# Patient Record
Sex: Male | Born: 1970 | Race: Black or African American | Hispanic: No | Marital: Married | Smoking: Former smoker
Health system: Southern US, Community
[De-identification: ages and names within clinical notes are randomized; demographics above are authoritative.]

## PROBLEM LIST (undated history)

## (undated) DIAGNOSIS — R509 Fever, unspecified: Secondary | ICD-10-CM

## (undated) DIAGNOSIS — E876 Hypokalemia: Secondary | ICD-10-CM

## (undated) DIAGNOSIS — K859 Acute pancreatitis without necrosis or infection, unspecified: Secondary | ICD-10-CM

## (undated) DIAGNOSIS — R140 Abdominal distension (gaseous): Secondary | ICD-10-CM

## (undated) HISTORY — DX: Hypokalemia: E87.6

## (undated) HISTORY — DX: Fever, unspecified: R50.9

## (undated) HISTORY — PX: APPENDECTOMY: SHX54

## (undated) HISTORY — DX: Abdominal distension (gaseous): R14.0

---

## 1997-11-26 ENCOUNTER — Encounter: Payer: Self-pay | Admitting: *Deleted

## 1997-11-26 ENCOUNTER — Emergency Department (HOSPITAL_COMMUNITY): Admission: EM | Admit: 1997-11-26 | Discharge: 1997-11-26 | Payer: Self-pay | Admitting: *Deleted

## 1998-01-20 ENCOUNTER — Encounter: Payer: Self-pay | Admitting: Emergency Medicine

## 1998-01-20 ENCOUNTER — Emergency Department (HOSPITAL_COMMUNITY): Admission: EM | Admit: 1998-01-20 | Discharge: 1998-01-20 | Payer: Self-pay | Admitting: Emergency Medicine

## 1998-08-16 ENCOUNTER — Emergency Department (HOSPITAL_COMMUNITY): Admission: EM | Admit: 1998-08-16 | Discharge: 1998-08-16 | Payer: Self-pay | Admitting: Emergency Medicine

## 1999-06-12 ENCOUNTER — Emergency Department (HOSPITAL_COMMUNITY): Admission: EM | Admit: 1999-06-12 | Discharge: 1999-06-12 | Payer: Self-pay | Admitting: Emergency Medicine

## 2002-05-12 ENCOUNTER — Emergency Department (HOSPITAL_COMMUNITY): Admission: EM | Admit: 2002-05-12 | Discharge: 2002-05-12 | Payer: Self-pay | Admitting: Emergency Medicine

## 2003-06-27 ENCOUNTER — Emergency Department (HOSPITAL_COMMUNITY): Admission: EM | Admit: 2003-06-27 | Discharge: 2003-06-27 | Payer: Self-pay | Admitting: Emergency Medicine

## 2004-01-22 ENCOUNTER — Emergency Department (HOSPITAL_COMMUNITY): Admission: EM | Admit: 2004-01-22 | Discharge: 2004-01-22 | Payer: Self-pay | Admitting: Emergency Medicine

## 2004-03-04 ENCOUNTER — Emergency Department (HOSPITAL_COMMUNITY): Admission: EM | Admit: 2004-03-04 | Discharge: 2004-03-04 | Payer: Self-pay | Admitting: Emergency Medicine

## 2005-10-25 ENCOUNTER — Emergency Department (HOSPITAL_COMMUNITY): Admission: EM | Admit: 2005-10-25 | Discharge: 2005-10-25 | Payer: Self-pay | Admitting: *Deleted

## 2006-12-21 ENCOUNTER — Observation Stay (HOSPITAL_COMMUNITY): Admission: EM | Admit: 2006-12-21 | Discharge: 2006-12-22 | Payer: Self-pay | Admitting: Emergency Medicine

## 2006-12-21 ENCOUNTER — Encounter (INDEPENDENT_AMBULATORY_CARE_PROVIDER_SITE_OTHER): Payer: Self-pay | Admitting: General Surgery

## 2010-07-26 NOTE — H&P (Signed)
NAME:  Ivan Harper, Ivan Harper NO.:  [id number]   MEDICAL RECORD NO.:  [id number]          PATIENT TYPE:  INP   LOCATION:  1522                         FACILITY:  [facility]   PHYSICIAN:  Adolph Pollack, M.D.DATE OF BIRTH:  [birth year]   DATE OF ADMISSION:  12/21/2006  DATE OF DISCHARGE:  12/22/2006                              HISTORY & PHYSICAL   REASON FOR ADMISSION:  Acute appendicitis.   HISTORY OF PRESENT ILLNESS:  Ivan Harper is a 40 year old male who awoke  this morning with some pressure-type periumbilical pain.  He got to  work, had a bowel movement, but continued to have the pain.  It  escalated and he had nausea and vomiting after drinking milk.  He  subsequently presented to the emergency department, was evaluated, and  was noted have right lower quadrant tenderness.  A CT scan was performed  demonstrating findings consistent with acute appendicitis but no  perforation.  It was at this time I was asked to see him.  He has not  had a fever.   PAST MEDICAL HISTORY:  Childhood seizures.   PREVIOUS OPERATIONS:  None.   ALLERGIES:  None.   MEDICATIONS:  None.   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  Occasional drinker  and a former smoker.   REVIEW OF SYSTEMS:  CARDIOVASCULAR:  No known heart disease or  hypertension.  PULMONARY:  No pneumonia, asthma, tuberculosis.  GI:  No  peptic ulcer disease, hepatitis, colitis, melena, hematochezia.  GU:  No  kidney stones, hematuria, dysuria.  ENDOCRINE:  No thyroid disease,  hypercholesterolemia.  NEUROLOGIC:  Seizure disorder as a child, which  he has outgrown.  HEMATOLOGIC:  No bleeding disorders, blood clots or  transfusions.   PHYSICAL EXAMINATION:  GENERALLY:  A well-developed, well-nourished  male, appears slightly ill.  Temperature 97.2, blood pressure 109/63,  pulse 65, O2 saturations 96-100% on room air.  EYES:  Extraocular motions intact.  No icterus.  NECK:  Supple without masses or  obvious thyroid enlargement.  RESPIRATORY:  Breath sounds equal and clear, respirations unlabored.  CARDIOVASCULAR:  Demonstrates a regular rate, regular rhythm.  No murmur  heard.  No JVD or lower extremity edema.  ABDOMEN:  Soft.  There is right lower quadrant tenderness and guarding  to palpation and percussion, but no mass noted.  No hernias noted.  MUSCULOSKELETAL:  He has good muscle tone, good range of motion.  NEUROLOGIC:  He is alert and oriented, answers questions appropriately,  has normal strength.   LABORATORY DATA:  Demonstrates a white count of 9600 with a left shift.  Glucose slightly elevated 131, potassium low at 3.4.  CT scan reviewed  and shows findings consistent with acute appendicitis without  perforation.   IMPRESSION:  Acute appendicitis.   PLAN:  Laparoscopic, possible open appendectomy.  The procedure and  risks including but not limited to bleeding, infection, wound healing  problems, anesthesia, and accidental damage to intra-abdominal organs  (such as intestines, bladder, etc.) have been discussed with him.  He  seems to understand and agrees to proceed.  Will also start him on IV  antibiotics.      Adolph Pollack, M.D.  Electronically Signed     TJR/MEDQ  D:  12/21/2006  T:  12/22/2006  Job:  [id number]

## 2010-07-26 NOTE — Op Note (Signed)
NAME:  [name], [name] NO.:  [id number]   MEDICAL RECORD NO.:  [id number]          PATIENT TYPE:  INP   LOCATION:  0112                         FACILITY:  [facility]   PHYSICIAN:  Adolph Pollack, M.D.DATE OF BIRTH:  [birth year]   DATE OF PROCEDURE:  12/21/2006  DATE OF DISCHARGE:                               OPERATIVE REPORT   PREOPERATIVE DIAGNOSIS:  Acute appendicitis.   POSTOPERATIVE DIAGNOSIS:  Acute appendicitis.   PROCEDURE:  Laparoscopic appendectomy.   SURGEON:  Adolph Pollack, M.D.   ANESTHESIA:  General.   INDICATIONS FOR PROCEDURE:  This 40 year old male presented to the  emergency department with persistent and increasing abdominal pain and  on evaluation was found to have acute appendicitis.  He is now brought  to the operating room.   SURGICAL TECHNIQUE:  He is brought to the operating room, placed supine  on the operating table, and general anesthetic was administered.  A  Foley catheter was placed in the bladder.  The hair on the abdominal  wall was clipped and the area sterilely prepped and draped.  Marcaine  was infiltrated in the subumbilical region and a subumbilical incision  was made through the skin, subcutaneous tissue, fascia and peritoneum  entering the peritoneal cavity.  A pursestring suture of 0 Vicryl was  placed around the fascial edges.  A Hassan trocar was introduced into  the peritoneal cavity and pneumoperitoneum created by insufflation of  CO2 gas.   Next, the laparoscope was introduced and he was placed in Trendelenburg  position with the right side tilted up.  A 5 mm trocar was placed  through a lower abdominal incision just to the left of midline.  A 5 mm  trocar was placed in the right upper quadrant.  I noted adhesions  between the cecum and the lateral sidewall which I divided sharply.  I  did notice the appendix coming off the cecum.  I traced this down into  the pelvis and noticed an acutely inflamed appendix  with some fibrinous  changes of the tip.  The appendix was adherent to the mesentery of the  distal ileum.  I carefully freed the appendix from the pelvic sidewall  and then gently dissected it free from the mesentery of the distal  ileum.  The appendix had some lateral sidewall attachments which I  divided sharply and then I was able to retract the appendix anteriorly.  The mesoappendix was then divided with the harmonic scalpel down to the  base of the cecum.  The appendix was amputated off the cecum with the  Endo-GIA stapler, placed in an Endopouch bag, and removed through the  subumbilical port.   The subumbilical port was replaced.  There was a raw surface along the  pelvic sidewall where some of the dissection was performed.  I copiously  irrigated out the right lower quadrant and examined the staple line  which was solid without bleeding.  There was a small amount of bleeding  from the pelvic sidewall which I controlled with the harmonic scalpel  and then placed Surgicel there.  Irrigation fluid was  evacuated and, at  this point, hemostasis was adequate.   Under laparoscopic vision, the lower abdominal trocar was removed and no  bleeding was noted.  The subumbilical trocar was removed and the fascial  defect closed under laparoscopic vision by tightening up and tying down  the  pursestring suture.  The CO2 gas was released and the remaining trocar  removed.  The skin incisions were then closed with 4-0 Monocryl  subcuticular stitches followed by Steri-Strips and sterile dressings.  He tolerated the procedure well without any apparent complications and  he was taken to recovery in satisfactory condition.      Adolph Pollack, M.D.  Electronically Signed     TJR/MEDQ  D:  12/21/2006  T:  12/22/2006  Job:  [id number]

## 2010-10-07 ENCOUNTER — Emergency Department (HOSPITAL_COMMUNITY)
Admission: EM | Admit: 2010-10-07 | Discharge: 2010-10-07 | Disposition: A | Discharge status: Home / Self Care | Payer: Worker's Compensation | Attending: Emergency Medicine | Admitting: Emergency Medicine

## 2010-10-07 DIAGNOSIS — T148XXA Other injury of unspecified body region, initial encounter: Secondary | ICD-10-CM | POA: Insufficient documentation

## 2010-10-07 DIAGNOSIS — Y92410 Unspecified street and highway as the place of occurrence of the external cause: Secondary | ICD-10-CM | POA: Insufficient documentation

## 2010-10-07 DIAGNOSIS — M542 Cervicalgia: Secondary | ICD-10-CM | POA: Insufficient documentation

## 2010-10-07 DIAGNOSIS — M549 Dorsalgia, unspecified: Secondary | ICD-10-CM | POA: Insufficient documentation

## 2010-12-22 LAB — BASIC METABOLIC PANEL
BUN: 10
CO2: 29
Calcium: 9
Chloride: 104
Creatinine, Ser: 0.81
GFR calc non Af Amer: >60
Glucose, Bld: 131 — ABNORMAL HIGH
Potassium: 3.4 — ABNORMAL LOW
Sodium: 140

## 2010-12-22 LAB — URINALYSIS, ROUTINE W REFLEX MICROSCOPIC
Bilirubin Urine: NEGATIVE
Glucose, UA: NEGATIVE
Nitrite: NEGATIVE
Protein, ur: NEGATIVE
pH: 7

## 2010-12-22 LAB — CBC
Hemoglobin: 12.5 — ABNORMAL LOW
RBC: 3.77 — ABNORMAL LOW
WBC: 9.6

## 2010-12-22 LAB — DIFFERENTIAL
Basophils Absolute: 0
Eosinophils Relative: 1
Lymphocytes Relative: 13
Lymphs Abs: 1.3

## 2012-09-30 ENCOUNTER — Encounter (HOSPITAL_COMMUNITY): Payer: Self-pay | Admitting: *Deleted

## 2012-09-30 ENCOUNTER — Emergency Department (HOSPITAL_COMMUNITY)
Admission: EM | Admit: 2012-09-30 | Discharge: 2012-09-30 | Disposition: A | Discharge status: Home / Self Care | Payer: Self-pay | Attending: Emergency Medicine | Admitting: Emergency Medicine

## 2012-09-30 DIAGNOSIS — Z23 Encounter for immunization: Secondary | ICD-10-CM | POA: Insufficient documentation

## 2012-09-30 DIAGNOSIS — S0990XA Unspecified injury of head, initial encounter: Secondary | ICD-10-CM | POA: Insufficient documentation

## 2012-09-30 DIAGNOSIS — IMO0002 Reserved for concepts with insufficient information to code with codable children: Secondary | ICD-10-CM | POA: Insufficient documentation

## 2012-09-30 DIAGNOSIS — S76219A Strain of adductor muscle, fascia and tendon of unspecified thigh, initial encounter: Secondary | ICD-10-CM

## 2012-09-30 DIAGNOSIS — Y9389 Activity, other specified: Secondary | ICD-10-CM | POA: Insufficient documentation

## 2012-09-30 DIAGNOSIS — Y92009 Unspecified place in unspecified non-institutional (private) residence as the place of occurrence of the external cause: Secondary | ICD-10-CM | POA: Insufficient documentation

## 2012-09-30 DIAGNOSIS — W208XXA Other cause of strike by thrown, projected or falling object, initial encounter: Secondary | ICD-10-CM | POA: Insufficient documentation

## 2012-09-30 MED ORDER — TETANUS-DIPHTH-ACELL PERTUSSIS 5-2.5-18.5 LF-MCG/0.5 IM SUSP
0.5000 mL | Freq: Once | INTRAMUSCULAR | Status: AC
  Administered 2012-09-30: 0.5 mL via INTRAMUSCULAR
  Filled 2012-09-30: qty 0.5

## 2012-09-30 NOTE — ED Notes (Signed)
Pt reports he was at home getting something out of pantry, and a 5 lb can fell on his head. No LOC. Not on blood thinners. Bleeding controlled. Small hematoma noted to top of pt's head. Pt wants to be evaluated to make sure he does not have concussion

## 2012-09-30 NOTE — ED Provider Notes (Signed)
   History    CSN: [id number] Arrival date & time 09/30/12  1241  First MD Initiated Contact with Patient 09/30/12 1251     Chief Complaint  Patient presents with  . Head Injury   (Consider location/radiation/quality/duration/timing/severity/associated sxs/prior Treatment) Patient is a 42 y.o. male presenting with head injury. The history is provided by the patient (pt states a can hit him in the head.  no loc). No language interpreter was used.  Head Injury Location:  Occipital Mechanism of injury: direct blow   Pain details:    Quality:  Aching   Severity:  Mild   Timing:  Constant Associated symptoms: headache   Associated symptoms: no seizures    History reviewed. No pertinent past medical history. Past Surgical History  Procedure Laterality Date  . Appendectomy     No family history on file. History  Substance Use Topics  . Smoking status: Never Smoker   . Smokeless tobacco: Not on file  . Alcohol Use: Yes    Review of Systems  Constitutional: Negative for appetite change and fatigue.  HENT: Negative for congestion, sinus pressure and ear discharge.   Eyes: Negative for discharge.  Respiratory: Negative for cough.   Cardiovascular: Negative for chest pain.  Gastrointestinal: Negative for abdominal pain and diarrhea.  Genitourinary: Negative for frequency and hematuria.  Musculoskeletal: Negative for back pain.  Skin: Negative for rash.  Neurological: Positive for headaches. Negative for seizures.  Psychiatric/Behavioral: Negative for hallucinations.    Allergies  Review of patient's allergies indicates no known allergies.  Home Medications   Current Outpatient Rx  Name  Route  Sig  Dispense  Refill  . Aspirin-Acetaminophen-Caffeine (GOODY HEADACHE PO)   Oral   Take 1 Package by mouth as needed (pain).          BP 138/92  Pulse 71  Temp(Src) 98.5 F (36.9 C) (Oral)  Resp 16  SpO2 99% Physical Exam  Constitutional: He is oriented to person,  place, and time. He appears well-developed.  HENT:  3 cm lac scalp  Eyes: Conjunctivae and EOM are normal. No scleral icterus.  Neck: Neck supple. No thyromegaly present.  Cardiovascular: Normal rate and regular rhythm.  Exam reveals no gallop and no friction rub.   No murmur heard. Pulmonary/Chest: No stridor. He has no wheezes. He has no rales. He exhibits no tenderness.  Abdominal: He exhibits no distension. There is no tenderness. There is no rebound.  Tender right groin,  No hernia  Musculoskeletal: Normal range of motion. He exhibits no edema.  Lymphadenopathy:    He has no cervical adenopathy.  Neurological: He is oriented to person, place, and time. Coordination normal.  Skin: No rash noted. No erythema.  Psychiatric: He has a normal mood and affect. His behavior is normal.    ED Course  LACERATION REPAIR Date/Time: 09/30/2012 1:30 PM Performed by: Jewelene Mairena L Authorized by: Bethann Berkshire L Comments: 5 staples used to close the 3 cm lac to occipital scalp   (including critical care time) Labs Reviewed - No data to display No results found. 1. Head injury, acute, initial encounter   2. Groin strain, initial encounter     MDM    Benny Lennert, MD 09/30/12 1331

## 2013-10-13 ENCOUNTER — Emergency Department (HOSPITAL_COMMUNITY)
Admission: EM | Admit: 2013-10-13 | Discharge: 2013-10-13 | Disposition: A | Discharge status: Home / Self Care | Payer: Self-pay | Attending: Emergency Medicine | Admitting: Emergency Medicine

## 2013-10-13 ENCOUNTER — Encounter (HOSPITAL_COMMUNITY): Payer: Self-pay | Admitting: Emergency Medicine

## 2013-10-13 DIAGNOSIS — R0789 Other chest pain: Secondary | ICD-10-CM

## 2013-10-13 DIAGNOSIS — R079 Chest pain, unspecified: Secondary | ICD-10-CM | POA: Insufficient documentation

## 2013-10-13 DIAGNOSIS — Z7982 Long term (current) use of aspirin: Secondary | ICD-10-CM | POA: Insufficient documentation

## 2013-10-13 DIAGNOSIS — R52 Pain, unspecified: Secondary | ICD-10-CM | POA: Insufficient documentation

## 2013-10-13 DIAGNOSIS — R071 Chest pain on breathing: Secondary | ICD-10-CM | POA: Insufficient documentation

## 2013-10-13 NOTE — ED Notes (Signed)
C/o pressure in center of chest since moving furniture at his house yesterday.  States pain feels like a pulled muscle and he felt it as soon as he went to lift heavy furniture.

## 2013-10-13 NOTE — ED Provider Notes (Signed)
CSN: [id number]     Arrival date & time 10/13/13  0606 History   First MD Initiated Contact with Patient 10/13/13 [phone]     Chief Complaint  Patient presents with  . Chest Pain     (Consider location/radiation/quality/duration/timing/severity/associated sxs/prior Treatment) HPI  Ivan Harper is a 43 y.o. male who injured his left chest wall, lifting, heavy for her, 3 days ago. He has had persistent pain, since then, which is on and off, and aggravated by movement. He has had a similar problem in the past. He denies shortness of breath, weakness, dizziness, back pain, nausea, vomiting. Today, he noticed the pain when he got up to go to work, so came here for evaluation. 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 There are no other known modifying factors.   History reviewed. No pertinent past medical history. Past Surgical History  Procedure Laterality Date  . Appendectomy     No family history on file. History  Substance Use Topics  . Smoking status: Never Smoker   . Smokeless tobacco: Not on file  . Alcohol Use: Yes    Review of Systems  All other systems reviewed and are negative.     Allergies  Review of patient's allergies indicates no known allergies.  Home Medications   Prior to Admission medications   Medication Sig Start Date End Date Taking? Authorizing Provider  aspirin 325 MG EC tablet Take 325 mg by mouth every 6 (six) hours as needed for pain.   Yes Historical Provider, MD   BP 119/85  Pulse 79  Temp(Src) 97.7 F (36.5 C) (Oral)  Resp 18  SpO2 100% Physical Exam  Nursing note and vitals reviewed. Constitutional: He is oriented to person, place, and time. He appears well-developed and well-nourished.  HENT:  Head: Normocephalic and atraumatic.  Right Ear: External ear normal.  Left Ear: External ear normal.  Eyes: Conjunctivae and EOM are normal. Pupils are equal, round, and reactive to light.  Neck: Normal range of motion and  phonation normal. Neck supple.  Cardiovascular: Normal rate, regular rhythm, normal heart sounds and intact distal pulses.   Pulmonary/Chest: Effort normal and breath sounds normal. He exhibits tenderness (left anterior, diffuse, mild.). He exhibits no bony tenderness.  Abdominal: Soft. There is no tenderness.  Musculoskeletal: Normal range of motion.  Neurological: He is alert and oriented to person, place, and time. No cranial nerve deficit or sensory deficit. He exhibits normal muscle tone. Coordination normal.  Skin: Skin is warm, dry and intact.  Psychiatric: He has a normal mood and affect. His behavior is normal. Judgment and thought content normal.    ED Course  Procedures (including critical care time) Labs Review Labs Reviewed - No data to display  Imaging Review No results found.   EKG Interpretation   Date/Time:  Monday October 13 2013 06:09:08 EDT Ventricular Rate:  82 PR Interval:  136 QRS Duration: 82 QT Interval:  352 QTC Calculation: 411 R Axis:   48 Text Interpretation:  Normal sinus rhythm Possible Left atrial enlargement  Borderline ECG since last tracing no significant change Confirmed by Tyr Franca   MD, Cerenity Goshorn ([id number]) on 10/13/2013 7:16:30 AM      MDM   Final diagnoses:  Acute chest wall pain    Chest wall pain related to injury, lifting. Doubt ACS, PE, or pneumonia.  Nursing Notes Reviewed/ Care Coordinated Applicable Imaging Reviewed Interpretation of Laboratory Data incorporated into ED treatment  The patient appears  reasonably screened and/or stabilized for discharge and I doubt any other medical condition or other [facility] requiring further screening, evaluation, or treatment in the ED at this time prior to discharge.  Plan: Home Medications- IBU prn; Home Treatments- rest; return here if the recommended treatment, does not improve the symptoms; Recommended follow up- PCP prn    Flint MelterElliott L Rocsi Hazelbaker, MD 10/13/13 [phone]

## 2013-10-13 NOTE — ED Notes (Signed)
Pt c/o chest soreness after moving furniture. Pt states he took ibuprofen and the pain sort of got better but not completely improved. Pt states pain is worse when he attempts to get up out of bed and when he lifts his arms above his head. Pt rates pain 3/10 at this time.

## 2013-10-13 NOTE — Discharge Instructions (Signed)
Chest Wall Pain °Chest wall pain is pain felt in or around the chest bones and muscles. It may take up to 6 weeks to get better. It may take longer if you are active. Chest wall pain can happen on its own. Other times, things like germs, injury, coughing, or exercise can cause the pain. °HOME CARE  °· Avoid activities that make you tired or cause pain. Try not to use your chest, belly (abdominal), or side muscles. Do not use heavy weights. °· Put ice on the sore area. °¨ Put ice in a plastic bag. °¨ Place a towel between your skin and the bag. °¨ Leave the ice on for 15-20 minutes for the first 2 days. °· Only take medicine as told by your doctor. °GET HELP RIGHT AWAY IF:  °· You have more pain or are very uncomfortable. °· You have a fever. °· Your chest pain gets worse. °· You have new problems. °· You feel sick to your stomach (nauseous) or throw up (vomit). °· You start to sweat or feel lightheaded. °· You have a cough with mucus (phlegm). °· You cough up blood. °MAKE SURE YOU:  °· Understand these instructions. °· Will watch your condition. °· Will get help right away if you are not doing well or get worse. °Document Released: 08/16/2007 Document Revised: 05/22/2011 Document Reviewed: 10/24/2010 °Exit[employer]® Patient Information ©2015 [employer]. This information is not intended to replace advice given to you by your health care provider. Make sure you discuss any questions you have with your health care provider. ° °

## 2014-04-15 ENCOUNTER — Emergency Department (HOSPITAL_COMMUNITY)
Admission: EM | Admit: 2014-04-15 | Discharge: 2014-04-15 | Disposition: A | Discharge status: Home / Self Care | Payer: No Typology Code available for payment source | Attending: Emergency Medicine | Admitting: Emergency Medicine

## 2014-04-15 ENCOUNTER — Encounter (HOSPITAL_COMMUNITY): Payer: Self-pay

## 2014-04-15 DIAGNOSIS — K029 Dental caries, unspecified: Secondary | ICD-10-CM | POA: Insufficient documentation

## 2014-04-15 DIAGNOSIS — Z792 Long term (current) use of antibiotics: Secondary | ICD-10-CM | POA: Insufficient documentation

## 2014-04-15 DIAGNOSIS — J029 Acute pharyngitis, unspecified: Secondary | ICD-10-CM | POA: Insufficient documentation

## 2014-04-15 DIAGNOSIS — K047 Periapical abscess without sinus: Secondary | ICD-10-CM | POA: Insufficient documentation

## 2014-04-15 MED ORDER — METHYLPREDNISOLONE SODIUM SUCC 125 MG IJ SOLR: 125.0000 mg | Freq: Once | INTRAMUSCULAR | Status: DC

## 2014-04-15 MED ORDER — PENICILLIN V POTASSIUM 500 MG PO TABS: 500.0000 mg | ORAL_TABLET | Freq: Four times a day (QID) | ORAL | Status: DC

## 2014-04-15 MED ORDER — HYDROCODONE-ACETAMINOPHEN 5-325 MG PO TABS: 1.0000 | ORAL_TABLET | ORAL | Status: DC | PRN

## 2014-04-15 MED ORDER — METHYLPREDNISOLONE SODIUM SUCC 125 MG IJ SOLR
125.0000 mg | Freq: Once | INTRAMUSCULAR | Status: AC
  Administered 2014-04-15: 125 mg via INTRAMUSCULAR
  Filled 2014-04-15: qty 2

## 2014-04-15 NOTE — Discharge Instructions (Signed)
Take the prescribed medication as directed. Monitor swelling closely-- if you notice significant increase, difficulty swallowing, shortness of breath, please return to the ED, otherwise follow-up with your dentist as soon as possible.

## 2014-04-15 NOTE — ED Notes (Signed)
Pt woke up this morning with swelling around mouth.  Pt has dental decay and soon to have surgery.  Mouth and face with pain

## 2014-04-15 NOTE — ED Provider Notes (Signed)
CSN: [id number]     Arrival date & time 04/15/14  1623 History  This chart was scribed for non-physician practitioner, Sharilyn SitesLisa Charlean Carneal, PA-C, working with Merrie RoofJohn David Wofford III, MD by Charline BillsEssence Howell, ED Scribe. This patient was seen in room WTR7/WTR7 and the patient's care was started at 4:37 PM.   Chief Complaint  Patient presents with  . Facial Swelling   The history is provided by the patient. No language interpreter was used.   HPI Comments: Ivan Harper is a 44 y.o. male who presents to the Emergency Department complaining of sudden onset of facial swelling around his mouth first noted this morning upon waking. Pt reports associated dental pain and gum swelling.  He reports upcoming dental surgery to remove his upper teeth and get fitted for dentures. Pt denies new medications or food. No known allergies.  No difficulty swallowing or shortness of breath.  VSS on arrival.  History reviewed. No pertinent past medical history. Past Surgical History  Procedure Laterality Date  . Appendectomy     History reviewed. No pertinent family history. History  Substance Use Topics  . Smoking status: Never Smoker   . Smokeless tobacco: Not on file  . Alcohol Use: Yes    Review of Systems  HENT: Positive for dental problem, facial swelling and sore throat.   All other systems reviewed and are negative.  Allergies  Review of patient's allergies indicates no known allergies.  Home Medications   Prior to Admission medications   Medication Sig Start Date End Date Taking? Authorizing Provider  aspirin 325 MG EC tablet Take 325 mg by mouth every 6 (six) hours as needed for pain.    Historical Provider, MD   Triage Vitals: BP 155/101 mmHg  Pulse 85  Temp(Src) 98.9 F (37.2 C) (Oral)  Resp 18  SpO2 99% Physical Exam  Constitutional: He is oriented to person, place, and time. He appears well-developed and well-nourished. No distress.  HENT:  Head: Normocephalic and atraumatic.   Mouth/Throat: Uvula is midline, oropharynx is clear and moist and mucous membranes are normal. Abnormal dentition. Dental abscesses and dental caries present. No oropharyngeal exudate, posterior oropharyngeal edema, posterior oropharyngeal erythema or tonsillar abscesses.  Teeth largely in very poor dentition, 4 front teeth are broken and nearly flush with gum line with cavities present, surrounding gingiva swollen and erythematous, gums locally TTP; mild swelling noted to inner margin of upper lip; no facial or neck swelling; handling secretions appropriately, no trismus, normal phonation, no stridor  Eyes: Conjunctivae and EOM are normal. Pupils are equal, round, and reactive to light.  Neck: Normal range of motion. Neck supple.  Cardiovascular: Normal rate, regular rhythm and normal heart sounds.   Pulmonary/Chest: Effort normal and breath sounds normal. No respiratory distress. He has no wheezes.  Musculoskeletal: Normal range of motion.  Neurological: He is alert and oriented to person, place, and time.  Skin: Skin is warm and dry. He is not diaphoretic.  Psychiatric: He has a normal mood and affect.  Nursing note and vitals reviewed.  ED Course  Procedures (including critical care time) DIAGNOSTIC STUDIES: Oxygen Saturation is 99% on RA, normal by my interpretation.    COORDINATION OF CARE: 4:39 PM-Discussed treatment plan which includes Solu-Medrol injection with pt at bedside and pt agreed to plan.   Labs Review Labs Reviewed - No data to display  Imaging Review No results found.   EKG Interpretation None      MDM   Final diagnoses:  Dental  infection   44 year old male with chronic dental infections scattered throughout his mouth. He will this morning with dental pain and mild swelling of his upper lip. He is scheduled to have all of his teeth removed by dentist in the next few weeks. Patient afebrile and nontoxic on exam. His 4 front teeth are broken and flushed with  gumline with obvious infection. The inner margin of his upper lip is mildly swollen without definitive abscess. He is handling his secretions well, speaking in full complete sentences. No cheek or neck swelling. No concern for lipids angina. Patient will be started on antibiotics and pain medication. He will follow-up with his dentist.  Discussed plan with patient, he/she acknowledged understanding and agreed with plan of care.  Return precautions given for new or worsening symptoms.  I personally performed the services described in this documentation, which was scribed in my presence. The recorded information has been reviewed and is accurate.  Garlon Hatchet, PA-C 04/15/14 1704  Merrie Roof, MD 04/16/14 [phone]

## 2014-06-06 ENCOUNTER — Emergency Department (HOSPITAL_COMMUNITY)
Admission: EM | Admit: 2014-06-06 | Discharge: 2014-06-06 | Disposition: A | Discharge status: Home / Self Care | Payer: Self-pay | Attending: Emergency Medicine | Admitting: Emergency Medicine

## 2014-06-06 ENCOUNTER — Encounter (HOSPITAL_COMMUNITY): Payer: Self-pay | Admitting: Emergency Medicine

## 2014-06-06 DIAGNOSIS — Y92410 Unspecified street and highway as the place of occurrence of the external cause: Secondary | ICD-10-CM | POA: Insufficient documentation

## 2014-06-06 DIAGNOSIS — Y9389 Activity, other specified: Secondary | ICD-10-CM | POA: Insufficient documentation

## 2014-06-06 DIAGNOSIS — T148XXA Other injury of unspecified body region, initial encounter: Secondary | ICD-10-CM

## 2014-06-06 DIAGNOSIS — Y998 Other external cause status: Secondary | ICD-10-CM | POA: Insufficient documentation

## 2014-06-06 DIAGNOSIS — S39012A Strain of muscle, fascia and tendon of lower back, initial encounter: Secondary | ICD-10-CM | POA: Insufficient documentation

## 2014-06-06 DIAGNOSIS — Z792 Long term (current) use of antibiotics: Secondary | ICD-10-CM | POA: Insufficient documentation

## 2014-06-06 MED ORDER — CYCLOBENZAPRINE HCL 10 MG PO TABS: 10.0000 mg | ORAL_TABLET | Freq: Two times a day (BID) | ORAL | Status: DC | PRN

## 2014-06-06 MED ORDER — IBUPROFEN 800 MG PO TABS: 800.0000 mg | ORAL_TABLET | Freq: Three times a day (TID) | ORAL | Status: DC

## 2014-06-06 NOTE — Discharge Instructions (Signed)
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
## 2014-06-06 NOTE — ED Notes (Signed)
Declined W/C at D/C and was escorted to lobby by RN. 
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
## 2015-03-27 ENCOUNTER — Emergency Department (HOSPITAL_COMMUNITY)
Admission: EM | Admit: 2015-03-27 | Discharge: 2015-03-27 | Disposition: A | Discharge status: Home / Self Care | Payer: No Typology Code available for payment source | Attending: Emergency Medicine | Admitting: Emergency Medicine

## 2015-03-27 ENCOUNTER — Encounter (HOSPITAL_COMMUNITY): Payer: Self-pay | Admitting: *Deleted

## 2015-03-27 DIAGNOSIS — Y998 Other external cause status: Secondary | ICD-10-CM | POA: Diagnosis not present

## 2015-03-27 DIAGNOSIS — Z7982 Long term (current) use of aspirin: Secondary | ICD-10-CM | POA: Diagnosis not present

## 2015-03-27 DIAGNOSIS — Y9389 Activity, other specified: Secondary | ICD-10-CM | POA: Insufficient documentation

## 2015-03-27 DIAGNOSIS — T148 Other injury of unspecified body region: Secondary | ICD-10-CM | POA: Insufficient documentation

## 2015-03-27 DIAGNOSIS — Y92410 Unspecified street and highway as the place of occurrence of the external cause: Secondary | ICD-10-CM | POA: Diagnosis not present

## 2015-03-27 DIAGNOSIS — M7918 Myalgia, other site: Secondary | ICD-10-CM

## 2015-03-27 MED ORDER — TIZANIDINE HCL 2 MG PO CAPS: 2.0000 mg | ORAL_CAPSULE | Freq: Three times a day (TID) | ORAL | Status: DC

## 2015-03-27 MED ORDER — IBUPROFEN 800 MG PO TABS: 800.0000 mg | ORAL_TABLET | Freq: Three times a day (TID) | ORAL | Status: DC
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
## 2015-03-27 NOTE — ED Notes (Signed)
Declined W/C at D/C and was escorted to lobby by RN. 
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
## 2015-03-27 NOTE — ED Notes (Signed)
See PA assessment 
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
## 2017-03-20 ENCOUNTER — Emergency Department (HOSPITAL_COMMUNITY)
Admission: EM | Admit: 2017-03-20 | Discharge: 2017-03-20 | Discharge status: Against Medical Advice | Payer: No Typology Code available for payment source

## 2017-03-22 ENCOUNTER — Emergency Department (HOSPITAL_COMMUNITY): Payer: Self-pay

## 2017-03-22 ENCOUNTER — Other Ambulatory Visit: Payer: Self-pay

## 2017-03-22 ENCOUNTER — Encounter (HOSPITAL_COMMUNITY): Payer: Self-pay | Admitting: Emergency Medicine

## 2017-03-22 ENCOUNTER — Emergency Department (HOSPITAL_COMMUNITY)
Admission: EM | Admit: 2017-03-22 | Discharge: 2017-03-22 | Disposition: A | Discharge status: Home / Self Care | Payer: Self-pay | Attending: Emergency Medicine | Admitting: Emergency Medicine

## 2017-03-22 DIAGNOSIS — R6889 Other general symptoms and signs: Secondary | ICD-10-CM | POA: Insufficient documentation

## 2017-03-22 DIAGNOSIS — Z79899 Other long term (current) drug therapy: Secondary | ICD-10-CM | POA: Insufficient documentation

## 2017-03-22 MED ORDER — BENZONATATE 100 MG PO CAPS: 100.0000 mg | ORAL_CAPSULE | Freq: Three times a day (TID) | ORAL | 0 refills | Status: DC

## 2017-03-22 MED ORDER — ONDANSETRON 4 MG PO TBDP: 4.0000 mg | ORAL_TABLET | Freq: Three times a day (TID) | ORAL | 0 refills | Status: DC | PRN

## 2017-03-22 MED ORDER — ONDANSETRON 4 MG PO TBDP
4.0000 mg | ORAL_TABLET | Freq: Once | ORAL | Status: AC
  Administered 2017-03-22: 4 mg via ORAL
  Filled 2017-03-22: qty 1

## 2017-03-22 MED ORDER — FLUTICASONE PROPIONATE 50 MCG/ACT NA SUSP: 2.0000 | Freq: Every day | NASAL | 12 refills | Status: DC

## 2017-03-22 NOTE — ED Notes (Signed)
Patient transported to X-ray 

## 2017-03-22 NOTE — ED Triage Notes (Signed)
Pt presents with cold chills, abd pain, diarrhea, generalized body aches; pt states "I feel like Im coming down with something"

## 2017-03-22 NOTE — Discharge Instructions (Signed)
Your chest x-ray did not show any signs of pneumonia.  Your symptoms seem consistent with a possible flu or other viral illness.  Have given you Zofran for any nausea.  Take Tessalon for cough.  Flonase for nasal congestion.  Continue using over-the-counter decongestants.  Drink plenty of fluids.  If your symptoms not improving the next 2-3 days return to the ED or follow with primary care doctor for recheck.

## 2017-03-22 NOTE — ED Provider Notes (Signed)
[facility] EMERGENCY DEPARTMENT Provider Note   CSN: [id number] Arrival date & time: 03/22/17  4098     History   Chief Complaint Chief Complaint  Patient presents with  . Chills  . Generalized Body Aches    HPI Ivan Harper is a 47 y.o. male.  HPI 47 year old African-American male with no pertinent past medical history presents to the ED with complaints of influenza-like symptoms.  Patient complains that 4 days ago he started to develop fevers, chills, nasal congestion, productive cough.  Patient states that he has had intermittent diarrhea and had an episode of emesis today.  Patient denies any blood in his vomit.  Patient states that he has been controlling his fever with over-the-counter analgesics and antipyretics.  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  Patient reports generalized abdominal cramping at times but denies any right now.  Patient denies any known sick contacts.  Patient is able to take p.o. fluids and food without difficulty.  Denies any urinary symptoms.  Denies any blood in his stool.  The patient states that his fever has been 102 max at home 2 days ago.  Denies any associated chest pain, shortness of breath, sore throat, otalgia, lightheadedness, dizziness, vision changes, headache. History reviewed. No pertinent past medical history.  There are no active problems to display for this patient.   Past Surgical History:  Procedure Laterality Date  . APPENDECTOMY         Home Medications    Prior to Admission medications   Medication Sig Start Date End Date Taking? Authorizing Provider  aspirin 325 MG EC tablet Take 325 mg by mouth every 6 (six) hours as needed for pain.    [provider]  benzonatate (TESSALON) 100 MG capsule Take 1 capsule (100 mg total) by mouth every 8 (eight) hours. 03/22/17   Rise Mu, PA-C  cyclobenzaprine (FLEXERIL) 10 MG tablet  Take 1 tablet (10 mg total) by mouth 2 (two) times daily as needed for muscle spasms. 06/06/14   Teressa Lower, NP  fluticasone (FLONASE) 50 MCG/ACT nasal spray Place 2 sprays into both nostrils daily. 03/22/17   Rise Mu, PA-C  HYDROcodone-acetaminophen (NORCO/VICODIN) 5-325 MG per tablet Take 1 tablet by mouth every 4 (four) hours as needed. 04/15/14   Garlon Hatchet, PA-C  ibuprofen (ADVIL,MOTRIN) 800 MG tablet Take 1 tablet (800 mg total) by mouth 3 (three) times daily. 03/27/15   Cartner, Sharlet Salina, PA-C  ondansetron (ZOFRAN-ODT) 4 MG disintegrating tablet Take 1 tablet (4 mg total) by mouth every 8 (eight) hours as needed for nausea. 03/22/17   Demetrios Loll T, PA-C  penicillin v potassium (VEETID) 500 MG tablet Take 1 tablet (500 mg total) by mouth 4 (four) times daily. 04/15/14   Garlon Hatchet, PA-C  tizanidine (ZANAFLEX) 2 MG capsule Take 1 capsule (2 mg total) by mouth 3 (three) times daily. 03/27/15   Joycie Peek, PA-C    Family History History reviewed. No pertinent family history.  Social History Social History   Tobacco Use  . Smoking status: Never Smoker  . Smokeless tobacco: Never Used  Substance Use Topics  . Alcohol use: Yes    Comment: social  . Drug use: No     Allergies   Patient has no known allergies.   Review of Systems Review of Systems  Constitutional: Positive for chills and fever.  HENT: Positive for congestion, postnasal drip,  rhinorrhea, sinus pressure and sinus pain. Negative for sore throat.   Eyes: Negative for visual disturbance.  Respiratory: Positive for cough. Negative for shortness of breath and wheezing.   Cardiovascular: Negative for chest pain.  Gastrointestinal: Positive for abdominal pain, diarrhea, nausea and vomiting.  Genitourinary: Negative for dysuria, flank pain, frequency, hematuria and urgency.  Musculoskeletal: Positive for myalgias. Negative for arthralgias.  Skin: Negative for rash.  Neurological: Negative  for dizziness, syncope, weakness, light-headedness, numbness and headaches.  Psychiatric/Behavioral: Negative for sleep disturbance. The patient is not nervous/anxious.      Physical Exam Updated Vital Signs BP (!) 131/92 (BP Location: Right Arm)   Pulse 92   Temp 97.9 F (36.6 C) (Oral)   Resp 18   Ht 5\' 9"  (1.753 m)   Wt 77.1 kg (170 lb)   SpO2 100%   BMI 25.10 kg/m   Physical Exam  Constitutional: He is oriented to person, place, and time. He appears well-developed and well-nourished.  Non-toxic appearance. No distress.  Patient is overall well-appearing and sleeping on my examination.  When awake and he appears to be in no acute distress.  HENT:  Head: Normocephalic and atraumatic.  Right Ear: Tympanic membrane, external ear and ear canal normal.  Left Ear: Tympanic membrane, external ear and ear canal normal.  Nose: Mucosal edema and rhinorrhea present. Right sinus exhibits frontal sinus tenderness. Right sinus exhibits no maxillary sinus tenderness. Left sinus exhibits frontal sinus tenderness. Left sinus exhibits no maxillary sinus tenderness.  Mouth/Throat: Uvula is midline and mucous membranes are normal. No trismus in the jaw. No uvula swelling. Posterior oropharyngeal erythema present. No oropharyngeal exudate, posterior oropharyngeal edema or tonsillar abscesses. No tonsillar exudate.  Eyes: Conjunctivae are normal. Pupils are equal, round, and reactive to light. Right eye exhibits no discharge. Left eye exhibits no discharge.  Mucous membranes are moist.  Neck: Normal range of motion. Neck supple.  Cardiovascular: Normal rate, regular rhythm, normal heart sounds and intact distal pulses. Exam reveals no gallop and no friction rub.  No murmur heard. Pulmonary/Chest: Effort normal and breath sounds normal. No stridor. No respiratory distress. He has no wheezes. He has no rales. He exhibits no tenderness.  Abdominal: Soft. Bowel sounds are normal. He exhibits no distension.  There is no tenderness. There is no rigidity, no rebound, no guarding, no CVA tenderness, no tenderness at McBurney's point and negative Murphy's sign.  Musculoskeletal: Normal range of motion. He exhibits no tenderness.  Lymphadenopathy:    He has no cervical adenopathy.  Neurological: He is alert and oriented to person, place, and time.  Skin: Skin is warm and dry. Capillary refill takes less than 2 seconds. No rash noted.  Good skin turgor.  Psychiatric: His behavior is normal. Judgment and thought content normal.  Nursing note and vitals reviewed.    ED Treatments / Results  Labs (all labs ordered are listed, but only abnormal results are displayed) Labs Reviewed - No data to display  EKG  EKG Interpretation None       Radiology Dg Chest 2 View  Result Date: 03/22/2017 CLINICAL DATA:  Shortness of breath and cough for 3 days. EXAM: CHEST  2 VIEW COMPARISON:  Chest x-ray dated December 21, 2006. FINDINGS: The heart size and mediastinal contours are within normal limits. Both lungs are clear. The visualized skeletal structures are unremarkable. IMPRESSION: Normal chest x-ray. Electronically Signed   By: Obie DredgeWilliam T Derry M.D.   On: 03/22/2017 07:08    Procedures Procedures (including critical  care time)  Medications Ordered in ED Medications  ondansetron (ZOFRAN-ODT) disintegrating tablet 4 mg (4 mg Oral Given 03/22/17 9604)     Initial Impression / Assessment and Plan / ED Course  I have reviewed the triage vital signs and the nursing notes.  Pertinent labs & imaging results that were available during my care of the patient were reviewed by me and considered in my medical decision making (see chart for details).     Patient presents to the ED with influenza-like symptoms that started 4 days ago.  Patient also reports associated diarrhea, abdominal cramping and one episode of emesis that is intermittent.  Patient is overall well-appearing and nontoxic.  Vital signs are  reassuring.  Patient is afebrile in the ED.  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  Patient denies any known sick contacts.  Patient's lungs clear to auscultation bilaterally.  He has no focal abdominal tenderness to palpation.  Bowel sounds are normal.  Heart regular rate and rhythm no rubs murmurs or gallops.  Chest x-ray was unremarkable for any signs of focal infiltrate.  Patient given Zofran in the ED.  Given normal vital signs and physical exam patient symptoms seem consistent with influenza-like illness.  Patient has no focal abdominal tenderness to palpation.  He has no significant signs of dehydration do not feel that lab work and imaging is indicated at this time.  When I go to reevaluate patient he is either sleeping or playing on his cell phone watching TV.  The patient has also asked that I give him 4 days off of work.  Instructed patient that I can only give him 2 days off of work.  I have given him a prescription for some Zofran, Flonase and Tessalon for cough.  The patient is outside the window for Tamiflu at this time.  Given that he also has diarrhea this would likely exacerbate his diarrhea.  Encouraged patient to drink plenty of fluids.  Pt is hemodynamically stable, in NAD, & able to ambulate in the ED. Evaluation does not show pathology that would require ongoing emergent intervention or inpatient treatment. I explained the diagnosis to the patient. Pain has been managed & has no complaints prior to dc. Pt is comfortable with above plan and is stable for discharge at this time. All questions were answered prior to disposition. Strict return precautions for f/u to the ED were discussed. Encouraged follow up with PCP.  Dicussed pt with Dr. Nicanor Alcon who is agreeable with the above plan.      Final Clinical Impressions(s) / ED Diagnoses   Final diagnoses:  Flu-like symptoms    ED Discharge Orders        Ordered    ondansetron  (ZOFRAN-ODT) 4 MG disintegrating tablet  Every 8 hours PRN     03/22/17 0716    benzonatate (TESSALON) 100 MG capsule  Every 8 hours     03/22/17 0716    fluticasone (FLONASE) 50 MCG/ACT nasal spray  Daily     03/22/17 0716       Rise Mu, PA-C 03/22/17 0734    Palumbo, April, MD 03/22/17 1805

## 2017-06-25 ENCOUNTER — Encounter (HOSPITAL_COMMUNITY): Payer: Self-pay | Admitting: Emergency Medicine

## 2017-06-25 ENCOUNTER — Inpatient Hospital Stay (HOSPITAL_COMMUNITY)
Admission: EM | Admit: 2017-06-25 | Discharge: 2017-07-05 | DRG: 438 | Disposition: A | Discharge status: Home / Self Care | Payer: Self-pay | Attending: Internal Medicine | Admitting: Internal Medicine

## 2017-06-25 ENCOUNTER — Other Ambulatory Visit: Payer: Self-pay

## 2017-06-25 ENCOUNTER — Ambulatory Visit (HOSPITAL_COMMUNITY): Payer: Self-pay | Attending: Physician Assistant

## 2017-06-25 ENCOUNTER — Inpatient Hospital Stay (HOSPITAL_COMMUNITY): Payer: Self-pay

## 2017-06-25 DIAGNOSIS — K567 Ileus, unspecified: Secondary | ICD-10-CM | POA: Diagnosis present

## 2017-06-25 DIAGNOSIS — F1010 Alcohol abuse, uncomplicated: Secondary | ICD-10-CM | POA: Diagnosis present

## 2017-06-25 DIAGNOSIS — N4889 Other specified disorders of penis: Secondary | ICD-10-CM | POA: Clinically undetermined

## 2017-06-25 DIAGNOSIS — J189 Pneumonia, unspecified organism: Secondary | ICD-10-CM | POA: Diagnosis not present

## 2017-06-25 DIAGNOSIS — K8521 Alcohol induced acute pancreatitis with uninfected necrosis: Principal | ICD-10-CM | POA: Diagnosis present

## 2017-06-25 DIAGNOSIS — D649 Anemia, unspecified: Secondary | ICD-10-CM | POA: Diagnosis not present

## 2017-06-25 DIAGNOSIS — R109 Unspecified abdominal pain: Secondary | ICD-10-CM | POA: Insufficient documentation

## 2017-06-25 DIAGNOSIS — T502X5A Adverse effect of carbonic-anhydrase inhibitors, benzothiadiazides and other diuretics, initial encounter: Secondary | ICD-10-CM | POA: Diagnosis not present

## 2017-06-25 DIAGNOSIS — R739 Hyperglycemia, unspecified: Secondary | ICD-10-CM | POA: Diagnosis present

## 2017-06-25 DIAGNOSIS — R748 Abnormal levels of other serum enzymes: Secondary | ICD-10-CM | POA: Insufficient documentation

## 2017-06-25 DIAGNOSIS — K56609 Unspecified intestinal obstruction, unspecified as to partial versus complete obstruction: Secondary | ICD-10-CM | POA: Clinically undetermined

## 2017-06-25 DIAGNOSIS — R509 Fever, unspecified: Secondary | ICD-10-CM

## 2017-06-25 DIAGNOSIS — K859 Acute pancreatitis without necrosis or infection, unspecified: Secondary | ICD-10-CM

## 2017-06-25 DIAGNOSIS — R631 Polydipsia: Secondary | ICD-10-CM | POA: Diagnosis present

## 2017-06-25 DIAGNOSIS — Z9049 Acquired absence of other specified parts of digestive tract: Secondary | ICD-10-CM

## 2017-06-25 DIAGNOSIS — E876 Hypokalemia: Secondary | ICD-10-CM

## 2017-06-25 DIAGNOSIS — Y95 Nosocomial condition: Secondary | ICD-10-CM | POA: Diagnosis not present

## 2017-06-25 DIAGNOSIS — E8770 Fluid overload, unspecified: Secondary | ICD-10-CM | POA: Diagnosis not present

## 2017-06-25 DIAGNOSIS — J90 Pleural effusion, not elsewhere classified: Secondary | ICD-10-CM | POA: Diagnosis not present

## 2017-06-25 DIAGNOSIS — Z0189 Encounter for other specified special examinations: Secondary | ICD-10-CM

## 2017-06-25 DIAGNOSIS — Z4659 Encounter for fitting and adjustment of other gastrointestinal appliance and device: Secondary | ICD-10-CM

## 2017-06-25 DIAGNOSIS — R140 Abdominal distension (gaseous): Secondary | ICD-10-CM

## 2017-06-25 DIAGNOSIS — E873 Alkalosis: Secondary | ICD-10-CM | POA: Diagnosis present

## 2017-06-25 DIAGNOSIS — E781 Pure hyperglyceridemia: Secondary | ICD-10-CM | POA: Diagnosis present

## 2017-06-25 DIAGNOSIS — R358 Other polyuria: Secondary | ICD-10-CM | POA: Diagnosis present

## 2017-06-25 DIAGNOSIS — K852 Alcohol induced acute pancreatitis without necrosis or infection: Secondary | ICD-10-CM

## 2017-06-25 DIAGNOSIS — F1011 Alcohol abuse, in remission: Secondary | ICD-10-CM | POA: Diagnosis present

## 2017-06-25 HISTORY — DX: Acute pancreatitis without necrosis or infection, unspecified: K85.90

## 2017-06-25 HISTORY — DX: Hyperglycemia, unspecified: R73.9

## 2017-06-25 LAB — LIPASE, BLOOD: LIPASE: 1230 U/L — AB (ref 11–51)

## 2017-06-25 LAB — URINALYSIS, ROUTINE W REFLEX MICROSCOPIC
BILIRUBIN URINE: NEGATIVE
GLUCOSE, UA: NEGATIVE mg/dL
HGB URINE DIPSTICK: NEGATIVE
Ketones, ur: NEGATIVE mg/dL
Leukocytes, UA: NEGATIVE
Nitrite: NEGATIVE
Protein, ur: NEGATIVE mg/dL
SPECIFIC GRAVITY, URINE: 1.013 (ref 1.005–1.030)
pH: 7 (ref 5.0–8.0)

## 2017-06-25 LAB — GLUCOSE, CAPILLARY: Glucose-Capillary: 128 mg/dL — ABNORMAL HIGH (ref 65–99)

## 2017-06-25 LAB — HEMOGLOBIN A1C
HEMOGLOBIN A1C: 4.8 % (ref 4.8–5.6)
MEAN PLASMA GLUCOSE: 91.06 mg/dL

## 2017-06-25 LAB — COMPREHENSIVE METABOLIC PANEL
ALBUMIN: 3.5 g/dL (ref 3.5–5.0)
ALT: 56 U/L (ref 17–63)
AST: 133 U/L — ABNORMAL HIGH (ref 15–41)
Alkaline Phosphatase: 58 U/L (ref 38–126)
Anion gap: 11 (ref 5–15)
BUN: 8 mg/dL (ref 6–20)
CHLORIDE: 98 mmol/L — AB (ref 101–111)
CO2: 25 mmol/L (ref 22–32)
CREATININE: 0.82 mg/dL (ref 0.61–1.24)
Calcium: 8.7 mg/dL — ABNORMAL LOW (ref 8.9–10.3)
GFR calc Af Amer: >60 mL/min (ref >60)
GFR calc non Af Amer: >60 mL/min (ref >60)
GLUCOSE: 119 mg/dL — AB (ref 65–99)
Potassium: 3.9 mmol/L (ref 3.5–5.1)
SODIUM: 134 mmol/L — AB (ref 135–145)
Total Bilirubin: 1.9 mg/dL — ABNORMAL HIGH (ref 0.3–1.2)
Total Protein: 6.2 g/dL — ABNORMAL LOW (ref 6.5–8.1)

## 2017-06-25 LAB — CBC
HCT: 35.4 % — ABNORMAL LOW (ref 39.0–52.0)
HEMOGLOBIN: 12.6 g/dL — AB (ref 13.0–17.0)
MCH: 34.1 pg — AB (ref 26.0–34.0)
MCHC: 35.6 g/dL (ref 30.0–36.0)
MCV: 95.7 fL (ref 78.0–100.0)
PLATELETS: 238 10*3/uL (ref 150–400)
RBC: 3.7 MIL/uL — AB (ref 4.22–5.81)
RDW: 12.5 % (ref 11.5–15.5)
WBC: 11.7 10*3/uL — ABNORMAL HIGH (ref 4.0–10.5)

## 2017-06-25 LAB — TRIGLYCERIDES: Triglycerides: 233 mg/dL — ABNORMAL HIGH (ref <150)

## 2017-06-25 MED ORDER — LACTATED RINGERS IV SOLN
INTRAVENOUS | Status: DC
  Administered 2017-06-25 – 2017-06-26 (×4): via INTRAVENOUS
  Administered 2017-06-26: 200 mL via INTRAVENOUS

## 2017-06-25 MED ORDER — SODIUM CHLORIDE 0.9 % IV BOLUS
1000.0000 mL | Freq: Once | INTRAVENOUS | Status: AC
  Administered 2017-06-25: 1000 mL via INTRAVENOUS

## 2017-06-25 MED ORDER — IOPAMIDOL (ISOVUE-300) INJECTION 61%
100.0000 mL | Freq: Once | INTRAVENOUS | Status: AC | PRN
  Administered 2017-06-25: 100 mL via INTRAVENOUS

## 2017-06-25 MED ORDER — IOPAMIDOL (ISOVUE-300) INJECTION 61%
INTRAVENOUS | Status: AC
  Filled 2017-06-25: qty 100

## 2017-06-25 MED ORDER — DICYCLOMINE HCL 10 MG/ML IM SOLN
20.0000 mg | Freq: Once | INTRAMUSCULAR | Status: AC
  Administered 2017-06-25: 20 mg via INTRAMUSCULAR
  Filled 2017-06-25: qty 2

## 2017-06-25 MED ORDER — MORPHINE SULFATE (PF) 4 MG/ML IV SOLN
4.0000 mg | Freq: Once | INTRAVENOUS | Status: AC
  Administered 2017-06-25: 4 mg via INTRAVENOUS
  Filled 2017-06-25: qty 1

## 2017-06-25 MED ORDER — ONDANSETRON HCL 4 MG/2ML IJ SOLN
4.0000 mg | Freq: Four times a day (QID) | INTRAMUSCULAR | Status: DC | PRN
  Administered 2017-06-25 – 2017-07-02 (×3): 4 mg via INTRAVENOUS
  Filled 2017-06-25 (×2): qty 2

## 2017-06-25 MED ORDER — MORPHINE SULFATE (PF) 4 MG/ML IV SOLN
2.0000 mg | INTRAVENOUS | Status: DC | PRN
  Administered 2017-06-25 – 2017-06-26 (×5): 2 mg via INTRAVENOUS
  Filled 2017-06-25 (×5): qty 1

## 2017-06-25 MED ORDER — HALOPERIDOL LACTATE 5 MG/ML IJ SOLN
2.0000 mg | Freq: Once | INTRAMUSCULAR | Status: AC
  Administered 2017-06-25: 2 mg via INTRAVENOUS
  Filled 2017-06-25: qty 1

## 2017-06-25 MED ORDER — KETOROLAC TROMETHAMINE 30 MG/ML IJ SOLN
30.0000 mg | Freq: Once | INTRAMUSCULAR | Status: AC
  Administered 2017-06-25: 30 mg via INTRAVENOUS
  Filled 2017-06-25: qty 1

## 2017-06-25 MED ORDER — LACTATED RINGERS IV SOLN
INTRAVENOUS | Status: DC
  Administered 2017-06-25: 10:00:00 via INTRAVENOUS

## 2017-06-25 MED ORDER — ACETAMINOPHEN 325 MG PO TABS
650.0000 mg | ORAL_TABLET | Freq: Four times a day (QID) | ORAL | Status: DC | PRN
  Administered 2017-06-26 – 2017-07-04 (×7): 650 mg via ORAL
  Filled 2017-06-25 (×7): qty 2

## 2017-06-25 MED ORDER — ENOXAPARIN SODIUM 40 MG/0.4ML ~~LOC~~ SOLN
40.0000 mg | SUBCUTANEOUS | Status: DC
  Administered 2017-06-25 – 2017-07-04 (×10): 40 mg via SUBCUTANEOUS
  Filled 2017-06-25 (×10): qty 0.4

## 2017-06-25 MED ORDER — ACETAMINOPHEN 650 MG RE SUPP
650.0000 mg | Freq: Four times a day (QID) | RECTAL | Status: DC | PRN
  Administered 2017-06-29: 650 mg via RECTAL
  Filled 2017-06-25: qty 1

## 2017-06-25 NOTE — ED Provider Notes (Signed)
[facility]   CSN: [id number] Arrival date & time: 06/25/17  0435     History   Chief Complaint Chief Complaint  Patient presents with  . Abdominal Pain    HPI Ivan Harper is a 47 y.o. male who is status post appendectomy several years ago, who presents to ED for evaluation of 24-hour history of generalized abdominal pain, several episodes of NBNB emesis and nausea.  He states he has had similar symptoms in the past due to "gas."  He has taken a "gas pill" with no improvement in his symptoms.  Reports no bowel movement today which is unusual for him since he has daily bowel movements.  Denies any sick contacts with similar symptoms, fever, dysuria, hematuria, recent travel, shortness of breath. Denies tobacco or drug use.  Reports occasional alcohol use.  HPI  History reviewed. No pertinent past medical history.  There are no active problems to display for this patient.   Past Surgical History:  Procedure Laterality Date  . APPENDECTOMY          Home Medications    Prior to Admission medications   Medication Sig Start Date End Date Taking? Authorizing Provider  aspirin 325 MG EC tablet Take 325 mg by mouth every 6 (six) hours as needed for pain.   Yes [provider]  cyclobenzaprine (FLEXERIL) 10 MG tablet Take 1 tablet (10 mg total) by mouth 2 (two) times daily as needed for muscle spasms. 06/06/14  Yes Teressa LowerPickering, Vrinda, NP  ondansetron (ZOFRAN-ODT) 4 MG disintegrating tablet Take 1 tablet (4 mg total) by mouth every 8 (eight) hours as needed for nausea. 03/22/17  Yes Rise MuLeaphart, Kenneth T, PA-C    Family History No family history on file.  Social History Social History   Tobacco Use  . Smoking status: Never Smoker  . Smokeless tobacco: Never Used  Substance Use Topics  . Alcohol use: Yes    Comment: social  . Drug use: No     Allergies   Patient has no known allergies.   Review of  Systems Review of Systems  Constitutional: Negative for appetite change, chills and fever.  HENT: Negative for ear pain, rhinorrhea, sneezing and sore throat.   Eyes: Negative for photophobia and visual disturbance.  Respiratory: Negative for cough, chest tightness, shortness of breath and wheezing.   Cardiovascular: Negative for chest pain and palpitations.  Gastrointestinal: Positive for abdominal pain, nausea and vomiting. Negative for blood in stool, constipation and diarrhea.  Genitourinary: Negative for dysuria, hematuria and urgency.  Musculoskeletal: Negative for myalgias.  Skin: Negative for rash.  Neurological: Negative for dizziness, weakness and light-headedness.     Physical Exam Updated Vital Signs BP (!) 136/93   Pulse 78   Temp 98.2 F (36.8 C) (Oral)   Resp 18   Ht 5\' 9"  (1.753 m)   Wt 77.1 kg (170 lb)   SpO2 100%   BMI 25.10 kg/m   Physical Exam  Constitutional: He appears well-developed and well-nourished. No distress.  HENT:  Head: Normocephalic and atraumatic.  Nose: Nose normal.  Eyes: Conjunctivae and EOM are normal. Left eye exhibits no discharge. No scleral icterus.  Neck: Normal range of motion. Neck supple.  Cardiovascular: Normal rate, regular rhythm, normal heart sounds and intact distal pulses. Exam reveals no gallop and no friction rub.  No murmur heard. Pulmonary/Chest: Effort normal and breath sounds normal. No respiratory distress.  Abdominal: Soft. Bowel sounds are normal. He exhibits  no distension. There is generalized tenderness. There is no guarding.  Musculoskeletal: Normal range of motion. He exhibits no edema.  Neurological: He is alert. He exhibits normal muscle tone. Coordination normal.  Skin: Skin is warm and dry. No rash noted.  Psychiatric: He has a normal mood and affect.  Nursing note and vitals reviewed.    ED Treatments / Results  Labs (all labs ordered are listed, but only abnormal results are displayed) Labs  Reviewed  LIPASE, BLOOD - Abnormal; Notable for the following components:      Result Value   Lipase 1,230 (*)    All other components within normal limits  COMPREHENSIVE METABOLIC PANEL - Abnormal; Notable for the following components:   Sodium 134 (*)    Chloride 98 (*)    Glucose, Bld 119 (*)    Calcium 8.7 (*)    Total Protein 6.2 (*)    AST 133 (*)    Total Bilirubin 1.9 (*)    All other components within normal limits  CBC - Abnormal; Notable for the following components:   WBC 11.7 (*)    RBC 3.70 (*)    Hemoglobin 12.6 (*)    HCT 35.4 (*)    MCH 34.1 (*)    All other components within normal limits  URINALYSIS, ROUTINE W REFLEX MICROSCOPIC  TRIGLYCERIDES    EKG EKG Interpretation  Date/Time:  Monday June 25 2017 07:30:37 EDT Ventricular Rate:  81 PR Interval:    QRS Duration: 85 QT Interval:  380 QTC Calculation: 442 R Axis:   48 Text Interpretation:  Sinus rhythm Abnormal R-wave progression, early transition Normal sinus rhythm Confirmed by Corlis Leak, Courteney ([id number]) on 06/25/2017 7:35:16 AM Also confirmed by Corlis Leak, Courteney ([id number]), editor Sheppard Evens [phone])  on 06/25/2017 8:10:24 AM   Radiology US Abdomen Limited  Result Date: 06/25/2017 CLINICAL DATA:  47 year old male with abdominal pain and vomiting 4 times in the past 24 hours. Elevated lipase. EXAM: ULTRASOUND ABDOMEN LIMITED RIGHT UPPER QUADRANT COMPARISON:  None. FINDINGS: Gallbladder: No gallstones or wall thickening visualized. No sonographic Murphy sign noted by sonographer. Common bile duct: Diameter: 2.6 mm Liver: No focal lesion identified. Within normal limits in parenchymal echogenicity. Portal vein is patent on color Doppler imaging with normal direction of blood flow towards the liver. IMPRESSION: Negative right upper quadrant ultrasound. Please note that pancreas was not assessed and if primary pancreatic abnormality is of concern as cause of elevated lipase, CT imaging may then be  considered. Electronically Signed   By: Lacy Duverney M.D.   On: 06/25/2017 09:30    Procedures Procedures (including critical care time)  Medications Ordered in ED Medications  morphine 4 MG/ML injection 4 mg (has no administration in time range)  sodium chloride 0.9 % bolus 1,000 mL (0 mLs Intravenous Stopped 06/25/17 0742)  ketorolac (TORADOL) 30 MG/ML injection 30 mg (30 mg Intravenous Given 06/25/17 0641)  dicyclomine (BENTYL) injection 20 mg (20 mg Intramuscular Given 06/25/17 0641)  haloperidol lactate (HALDOL) injection 2 mg (2 mg Intravenous Given 06/25/17 0832)  sodium chloride 0.9 % bolus 1,000 mL (1,000 mLs Intravenous New Bag/Given 06/25/17 2130)     Initial Impression / Assessment and Plan / ED Course  I have reviewed the triage vital signs and the nursing notes.  Pertinent labs & imaging results that were available during my care of the patient were reviewed by me and considered in my medical decision making (see chart for details).     Patient presents to ED  for evaluation of generalized abdominal pain, several episodes of emesis in the past day.  No sick contacts with similar symptoms.  Denies any diarrhea.  On physical exam patient has generalized abdominal tenderness to palpation including the epigastric area.  He is afebrile.  Lab work significant for lipase over 1000.  Right upper quadrant ultrasound done since AST elevated at greater than 100.  Remainder of lab work is unremarkable.  Triglycerides pending.  Patient will need to be admitted for acute pancreatitis, which she states he does not have a history of.  Portions of this note were generated with Scientist, clinical (histocompatibility and immunogenetics). Dictation errors may occur despite best attempts at proofreading.  Final Clinical Impressions(s) / ED Diagnoses   Final diagnoses:  Acute pancreatitis, unspecified complication status, unspecified pancreatitis type    ED Discharge Orders    None       Dietrich Pates, PA-C 06/25/17  1610    Abelino Derrick, MD 06/25/17 1604

## 2017-06-25 NOTE — H&P (Signed)
History and Physical    Ivan Harper ZOX:[id number] DOB: Aug 25, 1970 DOA: 06/25/2017  PCP: Patient, No Pcp Per - none in years Consultants:  None Patient coming from:  Home - lives with wife; NOK: wife, [phone]  Chief Complaint: abdominal pain  HPI: Ivan Harper is a 47 y.o. male with no significant past medical history presenting with abdominal pain starting yesterday about 4.  He drank a lot this weekend - all weekend.  He thinks he drank about 12 beers over the weekend.  +nausea, emesis 4-5 times since the pain started.  He has not seen blood in his emesis.  No fevers.  No sick contacts.  He does still have a gallbladder.   ED Course:   Denies h/o pancreatitis, occasional ETOH use.  Negative RUQ US.  Lipase >1000, AST about 100.  Abdominal CT pending.  Receiving IVF and pain meds.  Review of Systems: As per HPI; otherwise review of systems reviewed and negative.   Ambulatory Status:  Ambulates without assistance  History reviewed. No pertinent past medical history.  Past Surgical History:  Procedure Laterality Date  . APPENDECTOMY      Social History   Socioeconomic History  . Marital status: Married    Spouse name: Not on file  . Number of children: Not on file  . Years of education: Not on file  . Highest education level: Not on file  Occupational History  . Occupation: Investment banker, operationalchef for [employer]  Social Needs  . Financial resource strain: Not on file  . Food insecurity:    Worry: Not on file    Inability: Not on file  . Transportation needs:    Medical: Not on file    Non-medical: Not on file  Tobacco Use  . Smoking status: Never Smoker  . Smokeless tobacco: Never Used  Substance and Sexual Activity  . Alcohol use: Yes    Comment: weekends - excessive use  . Drug use: No  . Sexual activity: Not on file  Lifestyle  . Physical activity:    Days per week: Not on file    Minutes per session: Not on file  . Stress: Not on file  Relationships    . Social connections:    Talks on phone: Not on file    Gets together: Not on file    Attends religious service: Not on file    Active member of club or organization: Not on file    Attends meetings of clubs or organizations: Not on file    Relationship status: Not on file  . Intimate partner violence:    Fear of current or ex partner: Not on file    Emotionally abused: Not on file    Physically abused: Not on file    Forced sexual activity: Not on file  Other Topics Concern  . Not on file  Social History Narrative  . Not on file    No Known Allergies  Family History  Problem Relation Age of Onset  . Pancreatic disease Neg Hx     Prior to Admission medications   Medication Sig Start Date End Date Taking? Authorizing Provider  aspirin 325 MG EC tablet Take 325 mg by mouth every 6 (six) hours as needed for pain.   Yes [provider]  cyclobenzaprine (FLEXERIL) 10 MG tablet Take 1 tablet (10 mg total) by mouth 2 (two) times daily as needed for muscle spasms. 06/06/14  Yes Teressa LowerPickering, Vrinda, NP  ondansetron (ZOFRAN-ODT) 4 MG  disintegrating tablet Take 1 tablet (4 mg total) by mouth every 8 (eight) hours as needed for nausea. 03/22/17  Yes Rise Mu, PA-C    Physical Exam: Vitals:   06/25/17 0715 06/25/17 0730 06/25/17 0731 06/25/17 1016  BP: 140/85 (!) 136/93 (!) 136/93 (!) 148/94  Pulse: 72 83 78 83  Resp:  (!) 21 18 20   Temp:      TempSrc:      SpO2: 100% 99% 100% 100%  Weight:      Height:         General: Appears uncomfortable, writhing intermittently in pain Eyes:  PERRL, EOMI, normal lids, iris ENT:  grossly normal hearing, lips & tongue, mmm; very poor dentition Neck:  no LAD, masses or thyromegaly Cardiovascular:  RRR, no m/r/g. No LE edema.  Respiratory:   CTA bilaterally with no wheezes/rales/rhonchi.  Normal respiratory effort. Abdomen:  Diffusely exquisitely TTP, hypoactive but present BS Skin:  no rash or induration seen on limited  exam Musculoskeletal:  grossly normal tone BUE/BLE, good ROM, no bony abnormality Lower extremity:  No LE edema.  Limited foot exam with no ulcerations.  2+ distal pulses. Psychiatric:  grossly normal mood and affect, speech fluent and appropriate, AOx3 Neurologic:  CN 2-12 grossly intact, moves all extremities in coordinated fashion, sensation intact    Radiological Exams on Admission: US Abdomen Limited  Result Date: 06/25/2017 CLINICAL DATA:  47 year old male with abdominal pain and vomiting 4 times in the past 24 hours. Elevated lipase. EXAM: ULTRASOUND ABDOMEN LIMITED RIGHT UPPER QUADRANT COMPARISON:  None. FINDINGS: Gallbladder: No gallstones or wall thickening visualized. No sonographic Murphy sign noted by sonographer. Common bile duct: Diameter: 2.6 mm Liver: No focal lesion identified. Within normal limits in parenchymal echogenicity. Portal vein is patent on color Doppler imaging with normal direction of blood flow towards the liver. IMPRESSION: Negative right upper quadrant ultrasound. Please note that pancreas was not assessed and if primary pancreatic abnormality is of concern as cause of elevated lipase, CT imaging may then be considered. Electronically Signed   By: Lacy Duverney M.D.   On: 06/25/2017 09:30    EKG: Independently reviewed.  NSR with rate 81; no evidence of acute ischemia   Labs on Admission: I have personally reviewed the available labs and imaging studies at the time of the admission.  Pertinent labs:   Glucose 119; 131 on 12/21/06 Lipase 1230 AST 133/ALT 56/Bili 1.9 WBC 11.7 Hgb 12.6 - stable Normal UA  Assessment/Plan Principal Problem:   Acute pancreatitis Active Problems:   Hyperglycemia   Alcohol abuse   Acute pancreatitis -Patient without prior h/o pancreatitis -Now with frank pancreatitis by H&P, elevated lipase, elevated LFTs -LDH is pending, but assuming it is <300, his score is 0 with a mortality risk of 0.9%. -Will admit, as he is  likely to remain in the hospital for several days to complete his evaluation and treatment and allow the pancreatitis time to improve. -Strict NPO for now -Aggressive IVF hydration at least for the first 12 hours with LR at 200 cc/hr -Pain control with morphine 2 mg q1h prn.   -Nausea control with Zofran -The 4 most likely causes for pancreatitis include:  -Gallstones - no gallstones seen on RUQ [place]; CT pending per ER PA.  -Alcohol - acknowledges heavy weekend ETOH use, suspect this is the cause  Will check UDS.  -Medications - does not see a physician or take medications.  -Hypertriglyceridemia - triglycerides pending.  ETOH abuse -Weekend heavy use  including this past weekend - suspect this is the source of his pancreatitis. -Cessation encouraged. -UDS pending.  Hyperglycemia -May be stress response -Will follow with fasting AM labs and check A1c   DVT prophylaxis:  Lovenox Code Status:  Full - confirmed with patient Family Communication: None present Disposition Plan:  Home once clinically improved Consults called: None  Admission status: Admit - It is my clinical opinion that admission to INPATIENT is reasonable and necessary because of the expectation that this patient will require hospital care that crosses at least 2 midnights to treat this condition based on the medical complexity of the problems presented.  Given the aforementioned information, the predictability of an adverse outcome is felt to be significant.    Jonah Blue MD [facility]  If note is complete, please contact covering daytime or nighttime physician. [email or web address] Password [email or web address]  06/25/2017, 10:30 AM

## 2017-06-25 NOTE — ED Triage Notes (Signed)
Pt c/o generalized abd pain, emesis x 4 in last 24 hours, denies diarrhea.

## 2017-06-25 NOTE — Progress Notes (Signed)
New Admission Note: [facility] 5M06  Arrival Method: via stretcher Mental Orientation: alert and oriented x 4 Telemetry: N/A Assessment: Completed Skin: Intact IV: R hand  Pain: Denies Tubes: None Safety Measures: Safety Fall Prevention Plan has been discussed  Admission: to be completed [facility] [place] Orientation: Patient has been orientated to the room, unit and staff.   Family: None at bedside  Orders to be reviewed and implemented. Will continue to monitor the patient. Call light has been placed within reach and bed alarm has been activated.   Burley SaverKami Desyre Calma, BSN, RN-BC Phone: [phone]

## 2017-06-25 NOTE — ED Notes (Signed)
Patient transported to CT 

## 2017-06-26 LAB — COMPREHENSIVE METABOLIC PANEL
ALBUMIN: 2.7 g/dL — AB (ref 3.5–5.0)
ALK PHOS: 36 U/L — AB (ref 38–126)
ALT: 28 U/L (ref 17–63)
AST: 47 U/L — AB (ref 15–41)
Anion gap: 10 (ref 5–15)
BUN: 8 mg/dL (ref 6–20)
CALCIUM: 6.9 mg/dL — AB (ref 8.9–10.3)
CHLORIDE: 103 mmol/L (ref 101–111)
CO2: 26 mmol/L (ref 22–32)
CREATININE: 1.11 mg/dL (ref 0.61–1.24)
GFR calc non Af Amer: >60 mL/min (ref >60)
GLUCOSE: 134 mg/dL — AB (ref 65–99)
Potassium: 3.3 mmol/L — ABNORMAL LOW (ref 3.5–5.1)
SODIUM: 139 mmol/L (ref 135–145)
Total Bilirubin: 1.5 mg/dL — ABNORMAL HIGH (ref 0.3–1.2)
Total Protein: 5.7 g/dL — ABNORMAL LOW (ref 6.5–8.1)

## 2017-06-26 LAB — CBC
HCT: 39.4 % (ref 39.0–52.0)
Hemoglobin: 14.4 g/dL (ref 13.0–17.0)
MCH: 35 pg — AB (ref 26.0–34.0)
MCHC: 36.5 g/dL — AB (ref 30.0–36.0)
MCV: 95.6 fL (ref 78.0–100.0)
PLATELETS: 209 10*3/uL (ref 150–400)
RBC: 4.12 MIL/uL — AB (ref 4.22–5.81)
RDW: 12.5 % (ref 11.5–15.5)
WBC: 6 10*3/uL (ref 4.0–10.5)

## 2017-06-26 LAB — HIV ANTIBODY (ROUTINE TESTING W REFLEX): HIV SCREEN 4TH GENERATION: NONREACTIVE

## 2017-06-26 MED ORDER — DEXTROSE IN LACTATED RINGERS 5 % IV SOLN
INTRAVENOUS | Status: DC
Start: 2017-06-26 — End: 2017-06-27
  Administered 2017-06-26 – 2017-06-27 (×2): via INTRAVENOUS

## 2017-06-26 MED ORDER — MORPHINE SULFATE (PF) 4 MG/ML IV SOLN
2.0000 mg | INTRAVENOUS | Status: DC | PRN
  Administered 2017-06-27 – 2017-07-03 (×3): 2 mg via INTRAVENOUS
  Filled 2017-06-26 (×3): qty 1

## 2017-06-26 MED ORDER — POTASSIUM CHLORIDE 10 MEQ/100ML IV SOLN
10.0000 meq | INTRAVENOUS | Status: AC
  Administered 2017-06-26 (×2): 10 meq via INTRAVENOUS
  Filled 2017-06-26 (×3): qty 100

## 2017-06-26 MED ORDER — FAMOTIDINE IN NACL 20-0.9 MG/50ML-% IV SOLN
20.0000 mg | Freq: Two times a day (BID) | INTRAVENOUS | Status: DC
  Administered 2017-06-26 – 2017-07-02 (×13): 20 mg via INTRAVENOUS
  Filled 2017-06-26 (×13): qty 50

## 2017-06-26 MED ORDER — ATORVASTATIN CALCIUM 20 MG PO TABS
20.0000 mg | ORAL_TABLET | Freq: Every day | ORAL | Status: DC
  Administered 2017-06-26 – 2017-07-04 (×7): 20 mg via ORAL
  Filled 2017-06-26 (×8): qty 1

## 2017-06-26 NOTE — Progress Notes (Signed)
PROGRESS NOTE    Ivan Harper  [id number] DOB: December 01, 1970 DOA: 06/25/2017 PCP: Patient, No Pcp Per- none in years.   Brief Narrative:  Ivan Harper is a 47 year old male with no significant medical history who presented to the ED yesterday for acute onset abdominal pain beginning on 4/14. He reports usually drinking about 1-2 beers a week but over the weekend and had about 12 beers. Since then he developed nausea and emesis for about 4-5 times. He denies hematemesis, fever and any sick contacts. Reports still having a gallbladder. Upon admission vitals 98.2 F, blood pressure 140/87 mmHg, respirations 14, oxygen saturation 100 on room air. Patient was noted to appear uncomfortable and writhing intermittently in pain, moist mucous membranes with very poor dentition. No masses or thyromegaly, regular rate and rhythym, no m/r/g, No lower extremity edema. Respiratory effort normal and lungs clear to auscultation bilaterally with no wheezes, rales or rhonchi. Abdomen was diffusely exquisitely tender to palpation, hypoactive but present bowel sounds. 2+ distal pulses, moves all extremities. Sodium 134, Potassium 3.9, Chloride 989, CO2 25, Glucose 119, BUN 8, Creatinine 0.82, Calcium 8.7, AG 11, Lipase 1,230, AST 133, ALT 56, Total protein 6.2, Total bilirubin 1.9, Triglycerides 233, WBC 11.7, RBC 3.70, Hemoglobin 12.6, MCV 95.7, Platelets 238.  CT abdomen and pelvis demonstrated severe pancreatitis with a large amount of fluid around the distal pancreas with some hypoenhancement in the distal body concerning for pancreatic necrosis.  RUQ US unremarkable, EKG normal sinus rhythm. UA unremarkable.  He was admitted with a working diagnosis of acute pancreatitis possibly secondary to alcohol abuse.  Assessment & Plan:   Principal Problem:   Acute pancreatitis Active Problems:   Hyperglycemia   Alcohol abuse   Acute alcoholic pancreatitis  -BISAP score calculated to be 0. -Continue IVF, pain  control with morphine and nausea control with Zofran.  -No gallstones seen on RUQ US or CT abdomen. -Lipase elevated at 1,230, Triglycerides were elevated at 233 but not high enough to be considered hypertriglyceridemia.  -Continue NPO, may have ice chips.  Alcohol abuse -Watch for withdrawal symptoms. -Cessation encouraged. -UDS pending.   Hyperglycemia -Fasting labs this morning demonstrate glucose of 134.  -Patient reports chronic polydipsia/polyuria. -A1c 4.8  DVT prophylaxis: Lovenox. Code Status: FULL. Family Communication: None present. Disposition Plan: Home when clinically improved.   Consultants:   None.   Procedures:   None.  Antimicrobials:  None.   Subjective: Patient is awake, alert and oriented x 3. Interactive and calm, reports being in 7/10 pain but reports he was recently given morphine by the nurse. Experiencing diffuse abdominal pain, mild nausea and mild shortness of breath with exertion. Reports feeling very thirsty and is wondering when he will be able to drink water. Denies any chest pain, vomiting, hematemesis, diarrhea, constipation, hematochezia.   Objective: Vitals:   06/25/17 1300 06/25/17 1348 06/25/17 2058 06/26/17 0522  BP: (!) 149/98 (!) 163/96 138/89 129/87  Pulse: 84 88 98 (!) 109  Resp: (!) 27 18 19 18   Temp:  98.6 F (37 C) 98.4 F (36.9 C) 98 F (36.7 C)  TempSrc:  Oral Oral   SpO2: 99%  96%   Weight:      Height:        Intake/Output Summary (Last 24 hours) at 06/26/2017 0917 Last data filed at 06/26/2017 0601 Gross per 24 hour  Intake 4243.33 ml  Output 150 ml  Net 4093.33 ml   Filed Weights   06/25/17 0449  Weight: 77.1 kg (170 lb)    Examination:  General exam: Appears calm and comfortable  Respiratory system: Clear to auscultation. Patient is mildly short of breath while speaking. Cardiovascular system: S1 & S2 heard, RRR. No murmurs, rubs, gallops or clicks. No pedal edema. Gastrointestinal system:  Abdomen is distended and diffusely tender to palpation. No organomegaly or masses felt. Bowel sounds hypoactive but present. Central nervous system: Alert and oriented. No focal neurological deficits. Extremities: Moves all extremities. Skin: No rashes, lesions or ulcers. Psychiatry: Judgement and insight appear normal. Mood & affect appropriate.   Data Reviewed: I have personally reviewed following labs and imaging studies  CBC: Recent Labs  Lab 06/25/17 0512 06/26/17 0629  WBC 11.7* 6.0  HGB 12.6* 14.4  HCT 35.4* 39.4  MCV 95.7 95.6  PLT 238 209   Basic Metabolic Panel: Recent Labs  Lab 06/25/17 0512 06/26/17 0629  NA 134* 139  K 3.9 3.3*  CL 98* 103  CO2 25 26  GLUCOSE 119* 134*  BUN 8 8  CREATININE 0.82 1.11  CALCIUM 8.7* 6.9*   GFR: Estimated Creatinine Clearance: 81.4 mL/min (by C-G formula based on SCr of 1.11 mg/dL). Liver Function Tests: Recent Labs  Lab 06/25/17 0512 06/26/17 0629  AST 133* 47*  ALT 56 28  ALKPHOS 58 36*  BILITOT 1.9* 1.5*  PROT 6.2* 5.7*  ALBUMIN 3.5 2.7*   Recent Labs  Lab 06/25/17 0512  LIPASE 1,230*   No results for input(s): AMMONIA in the last 168 hours. Coagulation Profile: No results for input(s): INR, PROTIME in the last 168 hours. Cardiac Enzymes: No results for input(s): CKTOTAL, CKMB, CKMBINDEX, TROPONINI in the last 168 hours. BNP (last 3 results) No results for input(s): PROBNP in the last 8760 hours. HbA1C: Recent Labs    06/25/17 0512  HGBA1C 4.8   CBG: Recent Labs  Lab 06/25/17 2058  GLUCAP 128*   Lipid Profile: Recent Labs    06/25/17 0512  TRIG 233*   Thyroid Function Tests: No results for input(s): TSH, T4TOTAL, FREET4, T3FREE, THYROIDAB in the last 72 hours. Anemia Panel: No results for input(s): VITAMINB12, FOLATE, FERRITIN, TIBC, IRON, RETICCTPCT in the last 72 hours. Sepsis Labs: No results for input(s): PROCALCITON, LATICACIDVEN in the last 168 hours.  No results found for this or  any previous visit (from the past 240 hour(s)).   Radiology Studies: Ct Abdomen Pelvis W Contrast  Result Date: 06/25/2017 CLINICAL DATA:  Acute pancreatitis. EXAM: CT ABDOMEN AND PELVIS WITH CONTRAST TECHNIQUE: Multidetector CT imaging of the abdomen and pelvis was performed using the standard protocol following bolus administration of intravenous contrast. CONTRAST:  ISOVUE-300 IOPAMIDOL (ISOVUE-300) INJECTION 61% COMPARISON:  None. FINDINGS: Lower chest: No acute abnormality. Hepatobiliary: No focal liver abnormality is seen. No gallstones, gallbladder wall thickening, or biliary dilatation. Pancreas: Severe peripancreatic inflammatory changes around the pancreatic tail and distal body with a large amount of fluid around the distal body and pancreatic tail extending into the left pericolic gutter. Areas of hypoenhancement in the distal body of the pancreas concerning for pancreatic necrosis. Spleen: Normal in size without focal abnormality. Adrenals/Urinary Tract: Adrenal glands are unremarkable. Kidneys are normal, without renal calculi, focal lesion, or hydronephrosis. Bladder is unremarkable. Stomach/Bowel: Stomach is within normal limits. No evidence of bowel wall thickening, distention, or inflammatory changes. Vascular/Lymphatic: Normal caliber abdominal aorta with mild atherosclerosis. No lymphadenopathy. Reproductive: Prostate is unremarkable. Other: Small amount of pelvic free fluid. No abdominal hernia or inguinal hernia. Musculoskeletal: No acute or  significant osseous findings. IMPRESSION: 1. Severe pancreatitis of the distal body and pancreatic tail with a large amount of fluid around the distal pancreas. Areas of hypoenhancement in the distal body of the pancreas concerning for pancreatic necrosis. Electronically Signed   By: Elige Ko   On: 06/25/2017 12:29   US Abdomen Limited  Result Date: 06/25/2017 CLINICAL DATA:  47 year old male with abdominal pain and vomiting 4 times in  the past 24 hours. Elevated lipase. EXAM: ULTRASOUND ABDOMEN LIMITED RIGHT UPPER QUADRANT COMPARISON:  None. FINDINGS: Gallbladder: No gallstones or wall thickening visualized. No sonographic Murphy sign noted by sonographer. Common bile duct: Diameter: 2.6 mm Liver: No focal lesion identified. Within normal limits in parenchymal echogenicity. Portal vein is patent on color Doppler imaging with normal direction of blood flow towards the liver. IMPRESSION: Negative right upper quadrant ultrasound. Please note that pancreas was not assessed and if primary pancreatic abnormality is of concern as cause of elevated lipase, CT imaging may then be considered. Electronically Signed   By: Lacy Duverney M.D.   On: 06/25/2017 09:30    Scheduled Meds: . enoxaparin (LOVENOX) injection  40 mg Subcutaneous Q24H   Continuous Infusions: . lactated ringers 200 mL/hr at 06/26/17 0745     LOS: 1 day    Time spent: 25 minutes.  Loney Loh, PA-S [facility] Pager [phone]  If 7PM-7AM, please contact night-coverage [email or web address] Password [facility] 06/26/2017, 9:17 AM

## 2017-06-26 NOTE — Progress Notes (Signed)
Initial Nutrition Assessment  DOCUMENTATION CODES:   Not applicable  INTERVENTION:   -Diet progression as tolerated -Monitor need for nutrition supplementation  NUTRITION DIAGNOSIS:   Inadequate oral intake related to acute illness as evidenced by NPO status.  GOAL:   Patient will meet greater than or equal to 90% of their needs  MONITOR:   Diet advancement, PO intake, Labs, Weight trends  REASON FOR ASSESSMENT:   Malnutrition Screening Tool    ASSESSMENT:    Pt admitted with acute EtOH pancreatitis. Pt with no PMHx   Currently NPO. No N/V today, abdominal pain improving Pt with N/V 4-5 times after pain started after drinking 12 beers over the weekend. Pt reports PTA eating very well Reports 10 pound wt loss in the past several months without trying. 5.5% wt loss which is not significant for time frame Pt with poor dentition  Labs: lipase 1230, TG 233, corrected calcium 7.9 (albumin 2.7), HgbA1c 4.8 Meds: D5 at 100 ml/hr  Diet Order:  Diet NPO time specified Except for: Ice Chips  EDUCATION NEEDS:   No education needs have been identified at this time  Skin:  Skin Assessment: Reviewed RN Assessment  Last BM:  4/15  Height:   Ht Readings from Last 1 Encounters:  06/25/17 5\' 9"  (1.753 m)    Weight:   Wt Readings from Last 1 Encounters:  06/25/17 170 lb (77.1 kg)    Ideal Body Weight:     BMI:  Body mass index is 25.1 kg/m.  Estimated Nutritional Needs:   Kcal:  2100-2300 kcals  Protein:  105-115 g   Fluid:  >/= 2 L   Romelle Starcherate Taytem Ghattas MS, RD, LDN, CNSC [phone] Pager  [phone] Weekend/On-Call Pager

## 2017-06-26 NOTE — Progress Notes (Signed)
PROGRESS NOTE    Arnetha CourserWilliam R Hiley  [id number]:[id number] DOB: [birth year] DOA: 06/25/2017 PCP: Patient, No Pcp Per    Brief Narrative:  47 year old male who presented with abdominal pain.  He does not have any significant past medical history.  Reported abdominal pain for the last 4 days prior to hospitalization, generalized pain, severe in intensity, no improving factors, worse with movement and deep inspiration.  The abdominal pain is coincident with binge alcohol drinking, 12 beers.  On the initial physical examination blood pressure 140/85, heart rate 72, respiratory 21, oxygen saturation 99%.  Moist mucous membranes, lungs clear to auscultation bilaterally, heart S1-S2 present rhythmic, abdomen was tender to palpation, positive distention, no rebound, no lower extremity edema.  Sodium 134, potassium 3.9, chloride 98, bicarb 25, glucose 119, BUN 8, creatinine 0.82, lipase 1230, triglycerides 233, white count 11.7, hemoglobin 12.6, hematocrit 35.4, platelets 238.  AST 133, ALT 56.  urinalysis negative for infection.  CT of the abdomen with severe pancreatitis of the distal body pancreatic tail with a large amount of fluid around the distal pancreas.  Areas of hypoenhancement in the distal body of the pancreas concerning for pancreatic necrosis.  EKG with normal sinus rhythm, normal axis, normal intervals.  Patient was admitted to the hospital with a working diagnosis acute alcoholic pancreatitis, complicated by hypo-kalemia and transaminitis.    Assessment & Plan:   Principal Problem:   Acute pancreatitis Active Problems:   Hyperglycemia   Alcohol abuse   1.  Acute alcoholic pancreatitis.  Will continue hydration with balanced electrolyte solutions with dextrose, will decrease rate to 100 ml per hour to prevent volume overload, will follow a restrictive IV fluids strategy. Continue pain control with IV analgesics, will add IV antiacids and will continue as needed IV antiemetics. Abdomen still  distended and tender, decreased bowel sounds, will continue NPO, but of to have ice chips.   2.  Hypertriglyceridemia. Will place patient on stating and continue to follow up as outpatient  3. Hypokalemia with contraction alkalosis. Likely due to GI losses, will continue correction with Kcl IV, will continue IV fluids for hydration and follow on renal panel in am. Renal function preserved with serum cr at 1,11, serum bicarb at 26 and Na at 139.   4.  Transaminitis. AST elevated in respect to ALT, consistent with alcohol induced pattern, follow today AST down to 47 and ALT to 28. Bilirubins at 1,5 from 1,9.    DVT prophylaxis: enoxaparin   Code Status:  full Family Communication: I spoke with patient's family at the bedside and all questions were addressed.  Disposition Plan: home when clinically improved   Consultants:     Procedures:     Antimicrobials:       Subjective: Patient with persistent abdominal pain, worse with movement, improved with analgesics, no nausea or vomiting.   Objective: Vitals:   06/25/17 1300 06/25/17 1348 06/25/17 2058 06/26/17 0522  BP: (!) 149/98 (!) 163/96 138/89 129/87  Pulse: 84 88 98 (!) 109  Resp: (!) 27 18 19 18   Temp:  98.6 F (37 C) 98.4 F (36.9 C) 98 F (36.7 C)  TempSrc:  Oral Oral   SpO2: 99%  96%   Weight:      Height:        Intake/Output Summary (Last 24 hours) at 06/26/2017 1718 Last data filed at 06/26/2017 1659 Gross per 24 hour  Intake 3293.33 ml  Output 150 ml  Net 3143.33 ml   [employer]iled Weights  06/25/17 0449  Weight: 77.1 kg (170 lb)    Examination:   General: Not in pain or dyspnea, deconditioned Neurology: Awake and alert, non focal  E ENT: mild pallor, no icterus, oral mucosa moist Cardiovascular: No JVD. S1-S2 present, rhythmic, no gallops, rubs, or murmurs. No lower extremity edema. Pulmonary: decreased breath sounds bilaterally, adequate air movement, no wheezing, rhonchi or rales. Gastrointestinal.  Abdomen distended and tender to palpation, decreased bowel sounds.   Skin. No rashes Musculoskeletal: no joint deformities     Data Reviewed: I have personally reviewed following labs and imaging studies  CBC: Recent Labs  Lab 06/25/17 0512 06/26/17 0629  WBC 11.7* 6.0  HGB 12.6* 14.4  HCT 35.4* 39.4  MCV 95.7 95.6  PLT 238 209   Basic Metabolic Panel: Recent Labs  Lab 06/25/17 0512 06/26/17 0629  NA 134* 139  K 3.9 3.3*  CL 98* 103  CO2 25 26  GLUCOSE 119* 134*  BUN 8 8  CREATININE 0.82 1.11  CALCIUM 8.7* 6.9*   GFR: Estimated Creatinine Clearance: 81.4 mL/min (by C-G formula based on SCr of 1.11 mg/dL). Liver Function Tests: Recent Labs  Lab 06/25/17 0512 06/26/17 0629  AST 133* 47*  ALT 56 28  ALKPHOS 58 36*  BILITOT 1.9* 1.5*  PROT 6.2* 5.7*  ALBUMIN 3.5 2.7*   Recent Labs  Lab 06/25/17 0512  LIPASE 1,230*   No results for input(s): AMMONIA in the last 168 hours. Coagulation Profile: No results for input(s): INR, PROTIME in the last 168 hours. Cardiac Enzymes: No results for input(s): CKTOTAL, CKMB, CKMBINDEX, TROPONINI in the last 168 hours. BNP (last 3 results) No results for input(s): PROBNP in the last 8760 hours. HbA1C: Recent Labs    06/25/17 0512  HGBA1C 4.8   CBG: Recent Labs  Lab 06/25/17 2058  GLUCAP 128*   Lipid Profile: Recent Labs    06/25/17 0512  TRIG 233*   Thyroid Function Tests: No results for input(s): TSH, T4TOTAL, FREET4, T3FREE, THYROIDAB in the last 72 hours. Anemia Panel: No results for input(s): VITAMINB12, FOLATE, FERRITIN, TIBC, IRON, RETICCTPCT in the last 72 hours.    Radiology Studies: I have reviewed all of the imaging during this hospital visit personally     Scheduled Meds: . enoxaparin (LOVENOX) injection  40 mg Subcutaneous Q24H   Continuous Infusions: . dextrose 5% lactated ringers 100 mL/hr at 06/26/17 1712  . famotidine (PEPCID) IV Stopped (06/26/17 1659)  . potassium chloride 10  mEq (06/26/17 1717)     LOS: 1 day        Mauricio Annett Gula, MD [facility] Pager [phone]

## 2017-06-27 ENCOUNTER — Inpatient Hospital Stay (HOSPITAL_COMMUNITY): Payer: Self-pay

## 2017-06-27 DIAGNOSIS — K859 Acute pancreatitis without necrosis or infection, unspecified: Secondary | ICD-10-CM

## 2017-06-27 LAB — CBC WITH DIFFERENTIAL/PLATELET
BLASTS: 0 %
Band Neutrophils: 42 %
Basophils Absolute: 0 10*3/uL (ref 0.0–0.1)
Basophils Relative: 0 %
EOS PCT: 0 %
Eosinophils Absolute: 0 10*3/uL (ref 0.0–0.7)
HEMATOCRIT: 33.4 % — AB (ref 39.0–52.0)
HEMOGLOBIN: 11.6 g/dL — AB (ref 13.0–17.0)
LYMPHS ABS: 1.3 10*3/uL (ref 0.7–4.0)
LYMPHS PCT: 17 %
MCH: 33.5 pg (ref 26.0–34.0)
MCHC: 34.7 g/dL (ref 30.0–36.0)
MCV: 96.5 fL (ref 78.0–100.0)
MONOS PCT: 10 %
Metamyelocytes Relative: 0 %
Monocytes Absolute: 0.8 10*3/uL (ref 0.1–1.0)
Myelocytes: 0 %
NEUTROS ABS: 5.5 10*3/uL (ref 1.7–7.7)
NEUTROS PCT: 31 %
NRBC: 0 /100{WBCs}
OTHER: 0 %
Platelets: 185 10*3/uL (ref 150–400)
Promyelocytes Relative: 0 %
RBC: 3.46 MIL/uL — AB (ref 4.22–5.81)
RDW: 12.8 % (ref 11.5–15.5)
WBC Morphology: INCREASED
WBC: 7.6 10*3/uL (ref 4.0–10.5)

## 2017-06-27 LAB — BASIC METABOLIC PANEL
Anion gap: 8 (ref 5–15)
BUN: 10 mg/dL (ref 6–20)
CO2: 25 mmol/L (ref 22–32)
Calcium: 6.5 mg/dL — ABNORMAL LOW (ref 8.9–10.3)
Chloride: 103 mmol/L (ref 101–111)
Creatinine, Ser: 1 mg/dL (ref 0.61–1.24)
GFR calc Af Amer: >60 mL/min (ref >60)
GFR calc non Af Amer: >60 mL/min (ref >60)
GLUCOSE: 142 mg/dL — AB (ref 65–99)
Potassium: 3.1 mmol/L — ABNORMAL LOW (ref 3.5–5.1)
Sodium: 136 mmol/L (ref 135–145)

## 2017-06-27 LAB — MAGNESIUM: MAGNESIUM: 2 mg/dL (ref 1.7–2.4)

## 2017-06-27 LAB — LIPASE, BLOOD: Lipase: 144 U/L — ABNORMAL HIGH (ref 11–51)

## 2017-06-27 MED ORDER — THIAMINE HCL 100 MG/ML IJ SOLN: 100.0000 mg | Freq: Every day | INTRAMUSCULAR | Status: DC

## 2017-06-27 MED ORDER — OXYCODONE HCL 5 MG PO TABS
5.0000 mg | ORAL_TABLET | ORAL | Status: DC | PRN
  Administered 2017-06-27 – 2017-07-01 (×4): 5 mg via ORAL
  Filled 2017-06-27 (×4): qty 1

## 2017-06-27 MED ORDER — POTASSIUM CHLORIDE 10 MEQ/100ML IV SOLN
10.0000 meq | Freq: Once | INTRAVENOUS | Status: AC
  Administered 2017-06-27: 10 meq via INTRAVENOUS
  Filled 2017-06-27: qty 100

## 2017-06-27 MED ORDER — LORAZEPAM 1 MG PO TABS
1.0000 mg | ORAL_TABLET | Freq: Four times a day (QID) | ORAL | Status: AC | PRN
  Administered 2017-06-27 – 2017-06-28 (×3): 1 mg via ORAL
  Filled 2017-06-27 (×3): qty 1

## 2017-06-27 MED ORDER — FUROSEMIDE 10 MG/ML IJ SOLN
40.0000 mg | Freq: Once | INTRAMUSCULAR | Status: AC
  Administered 2017-06-27: 40 mg via INTRAVENOUS
  Filled 2017-06-27: qty 4

## 2017-06-27 MED ORDER — LORAZEPAM 2 MG/ML IJ SOLN: 0.0000 mg | Freq: Two times a day (BID) | INTRAMUSCULAR | Status: AC

## 2017-06-27 MED ORDER — LORAZEPAM 2 MG/ML IJ SOLN: 0.0000 mg | Freq: Four times a day (QID) | INTRAMUSCULAR | Status: AC

## 2017-06-27 MED ORDER — POTASSIUM CHLORIDE CRYS ER 20 MEQ PO TBCR
40.0000 meq | EXTENDED_RELEASE_TABLET | ORAL | Status: AC
  Administered 2017-06-27 (×2): 40 meq via ORAL
  Filled 2017-06-27 (×2): qty 2

## 2017-06-27 MED ORDER — ADULT MULTIVITAMIN W/MINERALS CH
1.0000 | ORAL_TABLET | Freq: Every day | ORAL | Status: DC
  Administered 2017-06-27 – 2017-07-05 (×7): 1 via ORAL
  Filled 2017-06-27 (×7): qty 1

## 2017-06-27 MED ORDER — LORAZEPAM 2 MG/ML IJ SOLN
1.0000 mg | Freq: Four times a day (QID) | INTRAMUSCULAR | Status: AC | PRN
  Administered 2017-06-29 – 2017-06-30 (×2): 1 mg via INTRAVENOUS
  Filled 2017-06-27 (×2): qty 1

## 2017-06-27 MED ORDER — SODIUM CHLORIDE 0.9 % IV BOLUS
2000.0000 mL | Freq: Once | INTRAVENOUS | Status: AC
  Administered 2017-06-27: 2000 mL via INTRAVENOUS

## 2017-06-27 MED ORDER — VITAMIN B-1 100 MG PO TABS
100.0000 mg | ORAL_TABLET | Freq: Every day | ORAL | Status: DC
  Administered 2017-06-27 – 2017-07-05 (×7): 100 mg via ORAL
  Filled 2017-06-27 (×7): qty 1

## 2017-06-27 MED ORDER — FOLIC ACID 1 MG PO TABS
1.0000 mg | ORAL_TABLET | Freq: Every day | ORAL | Status: DC
Start: 2017-06-27 — End: 2017-07-05
  Administered 2017-06-27 – 2017-07-05 (×7): 1 mg via ORAL
  Filled 2017-06-27 (×7): qty 1

## 2017-06-27 NOTE — Progress Notes (Signed)
PROGRESS NOTE    BRIT WERNETTE  ZOX:[id number] DOB: [birth year] DOA: 06/25/2017 PCP: Patient, No Pcp Per    Brief Narrative:  Tawni Pummel Morganis a 47 y.o.malewithno significant pastmedical historypresenting with abdominal pain starting 1 day prior to admission around 4 PM.  He drank a lot this weekend - all weekend. He thinks he drank about 12 beers over the weekend. +nausea, emesis 4-5 times since the pain started. He has not seen blood in his emesis. No fevers. No sick contacts. He does still have a gallbladder.   ED Course:   Denied h/o pancreatitis, occasional ETOH use.  Negative RUQ [place].  Lipase >1000, AST about 100.  Abdominal CT pending.  Receiving IVF and pain meds.      Assessment & Plan:   Principal Problem:   Acute pancreatitis Active Problems:   Hyperglycemia   Alcohol abuse  1 acute pancreatitis Likely secondary to alcohol use.  Patient presented abdominal pain nausea and emesis.  Lipase levels elevated at 1230 with a AST of 133 and ALT of 56.  CT abdomen and pelvis done with severe pancreatitis of the distal body and pancreatic tail with large amount of fluid around the distal pancreas.  Areas of hypo-enhancement in the distal body of the pancreas concerning for pancreatic necrosis.  CT abdomen and pelvis negative for gallstones or thickened gallbladder wall.  Triglycerides of 233.  Patient with some clinical improvement.  LFTs trending down.  Lipase now at 144.  Patient asking for clear liquids.  Will place on clear liquids for now.  Get abdominal ultrasound as patient noted to have some abdominal distention.  Give a 2 L bolus normal saline.  Increase IV fluids to 150 cc/h.  Supportive care.  2.  Alcohol abuse Alcohol cessation was stressed to patient.  Due to abdominal distention will get abdominal ultrasound to rule out ascites.  Place on the ativan withdrawal protocol.  Follow.  3.  Hypokalemia Resume level at 2.0.  Replete.  4.   Hyperlycemia Likely reactive.  Hemoglobin A1c of 4.8.  Follow.    DVT prophylaxis: Lovenox Code Status: Full Family Communication: Updated patient and wife at bedside. Disposition Plan: Home when clinically improved, improvement with abdominal pain, tolerating oral intake.   Consultants:   None  Procedures:  CT abdomen and pelvis 06/25/2017    Antimicrobials:  None   Subjective: Patient states improvement with abdominal pain.  Patient asking for clear liquids.  No chest pain.  No shortness of breath.  Patient states some lower abdominal discomfort.  Patient noted to have an episode of dry heaves after my examination.  Objective: Vitals:   06/26/17 1839 06/26/17 2103 06/27/17 0527 06/27/17 0923  BP: (!) 133/101 (!) 128/92 (!) 116/91 129/90  Pulse: (!) 130 (!) 126 (!) 123 (!) 121  Resp: 19 19 18 18   Temp: 98.5 F (36.9 C) 100 F (37.8 C) 98.3 F (36.8 C) 99.9 F (37.7 C)  TempSrc: Oral Oral Oral Oral  SpO2: 96% 96% 97% 94%  Weight:      Height:        Intake/Output Summary (Last 24 hours) at 06/27/2017 1241 Last data filed at 06/27/2017 1027 Gross per 24 hour  Intake 1581.67 ml  Output 300 ml  Net 1281.67 ml   Filed Weights   06/25/17 0449  Weight: 77.1 kg (170 lb)    Examination:  General exam: Appears calm and comfortable  Respiratory system: Clear to auscultation. Respiratory effort normal. Cardiovascular system: S1 & S2  heard, RRR. No JVD, murmurs, rubs, gallops or clicks. No pedal edema. Gastrointestinal system: Abdomen is mildly distended, soft, some tenderness to palpation.  No organomegaly or masses felt. Normal bowel sounds heard. Central nervous system: Alert and oriented. No focal neurological deficits. Extremities: Symmetric 5 x 5 power. Skin: No rashes, lesions or ulcers Psychiatry: Judgement and insight appear normal. Mood & affect appropriate.     Data Reviewed: I have personally reviewed following labs and imaging  studies  CBC: Recent Labs  Lab 06/25/17 0512 06/26/17 0629 06/27/17 0625  WBC 11.7* 6.0 7.6  NEUTROABS  --   --  5.5  HGB 12.6* 14.4 11.6*  HCT 35.4* 39.4 33.4*  MCV 95.7 95.6 96.5  PLT 238 209 185   Basic Metabolic Panel: Recent Labs  Lab 06/25/17 0512 06/26/17 0629 06/27/17 0625  NA 134* 139 136  K 3.9 3.3* 3.1*  CL 98* 103 103  CO2 25 26 25   GLUCOSE 119* 134* 142*  BUN 8 8 10   CREATININE 0.82 1.11 1.00  CALCIUM 8.7* 6.9* 6.5*  MG  --   --  2.0   GFR: Estimated Creatinine Clearance: 90.3 mL/min (by C-G formula based on SCr of 1 mg/dL). Liver Function Tests: Recent Labs  Lab 06/25/17 0512 06/26/17 0629  AST 133* 47*  ALT 56 28  ALKPHOS 58 36*  BILITOT 1.9* 1.5*  PROT 6.2* 5.7*  ALBUMIN 3.5 2.7*   Recent Labs  Lab 06/25/17 0512 06/27/17 0625  LIPASE 1,230* 144*   No results for input(s): AMMONIA in the last 168 hours. Coagulation Profile: No results for input(s): INR, PROTIME in the last 168 hours. Cardiac Enzymes: No results for input(s): CKTOTAL, CKMB, CKMBINDEX, TROPONINI in the last 168 hours. BNP (last 3 results) No results for input(s): PROBNP in the last 8760 hours. HbA1C: Recent Labs    06/25/17 0512  HGBA1C 4.8   CBG: Recent Labs  Lab 06/25/17 2058  GLUCAP 128*   Lipid Profile: Recent Labs    06/25/17 0512  TRIG 233*   Thyroid Function Tests: No results for input(s): TSH, T4TOTAL, FREET4, T3FREE, THYROIDAB in the last 72 hours. Anemia Panel: No results for input(s): VITAMINB12, FOLATE, FERRITIN, TIBC, IRON, RETICCTPCT in the last 72 hours. Sepsis Labs: No results for input(s): PROCALCITON, LATICACIDVEN in the last 168 hours.  No results found for this or any previous visit (from the past 240 hour(s)).       Radiology Studies: No results found.      Scheduled Meds: . atorvastatin  20 mg Oral q1800  . enoxaparin (LOVENOX) injection  40 mg Subcutaneous Q24H  . folic acid  1 mg Oral Daily  . LORazepam  0-4 mg  Intravenous Q6H   Followed by  . [START ON 06/29/2017] LORazepam  0-4 mg Intravenous Q12H  . multivitamin with minerals  1 tablet Oral Daily  . potassium chloride  40 mEq Oral Q4H  . thiamine  100 mg Oral Daily   Or  . thiamine  100 mg Intravenous Daily   Continuous Infusions: . dextrose 5% lactated ringers 150 mL/hr at 06/27/17 0955  . famotidine (PEPCID) IV 20 mg (06/27/17 1001)     LOS: 2 days    Time spent: 35 minutes    Ramiro Harvestaniel Fabianna Keats, MD [facility] Pager [phone]  If 7PM-7AM, please contact night-coverage [email or web address] Password [email or web address] 06/27/2017, 12:41 PM

## 2017-06-27 NOTE — Care Management Note (Addendum)
Case Management Note  Patient Details  Name: Ivan Harper MRN: [id number] Date of Birth: 1970/04/02  Subjective/Objective:     Admitted for Acute alcoholic pancreatitis            Action/Plan: Prior to admission patient lived at home with spouse.  Will be returning to the same living situation after discharge.  At discharge, patient has transportation home.  Patient has the ability to pay for medications and food.  NCM will continue to follow for discharge needs.  Expected Discharge Date:                  Expected Discharge Plan:  Home/Self Care  In-House Referral:     Discharge planning Services  [facility] Consult, [facility]  Status of Service:  In process, will continue to follow  Yancey FlemingsKimberly R Marry Kusch, RN 06/27/2017, 10:16 AM

## 2017-06-28 ENCOUNTER — Inpatient Hospital Stay (HOSPITAL_COMMUNITY): Payer: Self-pay

## 2017-06-28 ENCOUNTER — Other Ambulatory Visit: Payer: Self-pay

## 2017-06-28 DIAGNOSIS — N4889 Other specified disorders of penis: Secondary | ICD-10-CM | POA: Clinically undetermined

## 2017-06-28 HISTORY — DX: Other specified disorders of penis: N48.89

## 2017-06-28 LAB — COMPREHENSIVE METABOLIC PANEL
ALBUMIN: 2.4 g/dL — AB (ref 3.5–5.0)
ALT: 35 U/L (ref 17–63)
ANION GAP: 7 (ref 5–15)
AST: 83 U/L — ABNORMAL HIGH (ref 15–41)
Alkaline Phosphatase: 50 U/L (ref 38–126)
BUN: 8 mg/dL (ref 6–20)
CHLORIDE: 107 mmol/L (ref 101–111)
CO2: 26 mmol/L (ref 22–32)
Calcium: 6.8 mg/dL — ABNORMAL LOW (ref 8.9–10.3)
Creatinine, Ser: 0.97 mg/dL (ref 0.61–1.24)
GFR calc non Af Amer: >60 mL/min (ref >60)
GLUCOSE: 123 mg/dL — AB (ref 65–99)
POTASSIUM: 3.4 mmol/L — AB (ref 3.5–5.1)
SODIUM: 140 mmol/L (ref 135–145)
Total Bilirubin: 1.4 mg/dL — ABNORMAL HIGH (ref 0.3–1.2)
Total Protein: 6 g/dL — ABNORMAL LOW (ref 6.5–8.1)

## 2017-06-28 LAB — CBC
HEMATOCRIT: 29.1 % — AB (ref 39.0–52.0)
Hemoglobin: 10 g/dL — ABNORMAL LOW (ref 13.0–17.0)
MCH: 32.8 pg (ref 26.0–34.0)
MCHC: 34.4 g/dL (ref 30.0–36.0)
MCV: 95.4 fL (ref 78.0–100.0)
Platelets: 182 10*3/uL (ref 150–400)
RBC: 3.05 MIL/uL — AB (ref 4.22–5.81)
RDW: 12.7 % (ref 11.5–15.5)
WBC: 7.8 10*3/uL (ref 4.0–10.5)

## 2017-06-28 LAB — LIPASE, BLOOD: LIPASE: 127 U/L — AB (ref 11–51)

## 2017-06-28 MED ORDER — METOPROLOL TARTRATE 5 MG/5ML IV SOLN
5.0000 mg | Freq: Once | INTRAVENOUS | Status: AC
  Administered 2017-06-28: 5 mg via INTRAVENOUS
  Filled 2017-06-28: qty 5

## 2017-06-28 MED ORDER — SIMETHICONE 80 MG PO CHEW
160.0000 mg | CHEWABLE_TABLET | Freq: Four times a day (QID) | ORAL | Status: DC
  Administered 2017-06-28 – 2017-07-05 (×21): 160 mg via ORAL
  Filled 2017-06-28 (×23): qty 2

## 2017-06-28 MED ORDER — FUROSEMIDE 10 MG/ML IJ SOLN: 60.0000 mg | Freq: Once | INTRAMUSCULAR | Status: DC

## 2017-06-28 MED ORDER — FUROSEMIDE 10 MG/ML IJ SOLN
60.0000 mg | Freq: Once | INTRAMUSCULAR | Status: AC
  Administered 2017-06-28: 60 mg via INTRAVENOUS
  Filled 2017-06-28: qty 6

## 2017-06-28 MED ORDER — POTASSIUM CHLORIDE CRYS ER 20 MEQ PO TBCR
40.0000 meq | EXTENDED_RELEASE_TABLET | Freq: Once | ORAL | Status: AC
  Administered 2017-06-28: 40 meq via ORAL
  Filled 2017-06-28: qty 2

## 2017-06-28 MED ORDER — BISACODYL 10 MG RE SUPP
10.0000 mg | Freq: Once | RECTAL | Status: AC
  Administered 2017-06-28: 10 mg via RECTAL
  Filled 2017-06-28: qty 1

## 2017-06-28 NOTE — Progress Notes (Signed)
PROGRESS NOTE    Ivan VANDENBERGHE  GNF:[id number] DOB: 1970-11-22 DOA: 06/25/2017 PCP: Patient, No Pcp Per    Brief Narrative:  Ivan Harper a 47 y.o.malewithno significant pastmedical historypresenting with abdominal pain starting 1 day prior to admission around 4 PM.  He drank a lot this weekend - all weekend. He thinks he drank about 12 beers over the weekend. +nausea, emesis 4-5 times since the pain started. He has not seen blood in his emesis. No fevers. No sick contacts. He does still have a gallbladder.   ED Course:   Denied h/o pancreatitis, occasional ETOH use.  Negative RUQ [place].  Lipase >1000, AST about 100.  Abdominal CT pending.  Receiving IVF and pain meds.      Assessment & Plan:   Principal Problem:   Acute pancreatitis Active Problems:   Hyperglycemia   Alcohol abuse  1 acute pancreatitis Likely secondary to alcohol use.  Patient presented abdominal pain nausea and emesis.  Lipase levels elevated at 1230 with a AST of 133 and ALT of 56.  CT abdomen and pelvis done with severe pancreatitis of the distal body and pancreatic tail with large amount of fluid around the distal pancreas.  Areas of hypo-enhancement in the distal body of the pancreas concerning for pancreatic necrosis.  CT abdomen and pelvis negative for gallstones or thickened gallbladder wall.  Triglycerides of 233.  Patient with some clinical improvement.  LFTs trending down.  Lipase now at 127.  Patient rating clear liquids.  Will advance to full liquid diet and monitor.  Abdominal ultrasound negative for ascites however does have some fluid-filled small bowel.  Get KUB abdomen.  Saline lock IV fluids.  Lasix IV x2 doses.  Supportive care.  Follow.   2.  Alcohol abuse Alcohol cessation was stressed to patient.  Due to abdominal distention abdominal ultrasound was obtained which was negative for ascites however consistent with pancreatitis, fluid filled small bowel, scattered intraperitoneal  fluid, no evidence of gallstones.  Continue ativan withdrawal protocol.  Follow.  3.  Hypokalemia Replete.  Magnesium level was 2.   4.  Hyperlycemia Likely reactive.  Hemoglobin A1c of 4.8.  Follow.   5 penile swelling Question onset.  Patient is circumcised.  Denies any pain.  No discharge.  Patient is currently afebrile.  Able to urinate without any problems.  No dysuria.  We will give a couple doses of IV Lasix.  Follow.  If no significant improvement will need outpatient follow-up with urology.  6.  Abdominal distention Patient with some diffuse abdominal pain.  Order abdominal films to rule out ileus versus small bowel obstruction.     DVT prophylaxis: Lovenox Code Status: Full Family Communication: Updated patient and wife at bedside. Disposition Plan: Home when clinically improved, improvement with abdominal pain, tolerating oral intake for the next 24-48 hours.   Consultants:   None  Procedures:  CT abdomen and pelvis 06/25/2017  Abdominal ultrasound 06/27/2017  [facility] ultrasound 06/25/2017  Antimicrobials:  None   Subjective: Patient states epigastric abdominal pain improved.  Tolerating clears.  Did not order full liquids.  Complaints of abdominal tightness.  Feels better after diuretics yesterday.  No chest pain.  No shortness of breath.  Complaining of penile swelling.  Objective: Vitals:   06/27/17 1839 06/27/17 2101 06/28/17 0444 06/28/17 0944  BP: (!) 136/98 135/87 (!) 131/93 139/76  Pulse: (!) 143 (!) 125 (!) 124 (!) 129  Resp: Temp: 99.4 F (37.4 C) (!)  101.3 F (38.5 C) 99 F (37.2 C) 99 F (37.2 C)  TempSrc: Oral Oral Oral Oral  SpO2: 93% 92% (!) 88% 96%  Weight:      Height:        Intake/Output Summary (Last 24 hours) at 06/28/2017 1245 Last data filed at 06/28/2017 1002 Gross per 24 hour  Intake 1200 ml  Output 1675 ml  Net -475 ml   Filed Weights   06/25/17 0449  Weight: 77.1 kg (170 lb)     Examination:  General exam: Appears calm and comfortable  Respiratory system: Clear to auscultation bilaterally.  No wheezes, no crackles, no rhonchi.  Respiratory effort normal. Cardiovascular system: Tachycardic.  No JVD, no murmurs, no rubs.  No lower extremity edema.  Gastrointestinal system: Abdomen is mildly distended, some tightness, some diffuse tenderness to palpation.  No organomegaly or masses felt. Normal bowel sounds heard. Central nervous system: Alert and oriented. No focal neurological deficits. Extremities: Symmetric 5 x 5 power.  Genitourinary: Patient with swollen shaft of penis, nontender, no drainage, circumcised, no warmth, no erythema. Skin: No rashes, lesions or ulcers Psychiatry: Judgement and insight appear normal. Mood & affect appropriate.     Data Reviewed: I have personally reviewed following labs and imaging studies  CBC: Recent Labs  Lab 06/25/17 0512 06/26/17 0629 06/27/17 0625 06/28/17 0609  WBC 11.7* 6.0 7.6 7.8  NEUTROABS  --   --  5.5  --   HGB 12.6* 14.4 11.6* 10.0*  HCT 35.4* 39.4 33.4* 29.1*  MCV 95.7 95.6 96.5 95.4  PLT 238 209 185 182   Basic Metabolic Panel: Recent Labs  Lab 06/25/17 0512 06/26/17 0629 06/27/17 0625 06/28/17 0609  NA 134* 139 136 140  K 3.9 3.3* 3.1* 3.4*  CL 98* 103 103 107  CO2 GLUCOSE 119* 134* 142* 123*  BUN CREATININE 0.82 1.11 1.00 0.97  CALCIUM 8.7* 6.9* 6.5* 6.8*  MG  --   --  2.0  --    GFR: Estimated Creatinine Clearance: 93.1 mL/min (by C-G formula based on SCr of 0.97 mg/dL). Liver Function Tests: Recent Labs  Lab 06/25/17 0512 06/26/17 0629 06/28/17 0609  AST 133* 47* 83*  ALT 56 28 35  ALKPHOS 58 36* 50  BILITOT 1.9* 1.5* 1.4*  PROT 6.2* 5.7* 6.0*  ALBUMIN 3.5 2.7* 2.4*   Recent Labs  Lab 06/25/17 0512 06/27/17 0625 06/28/17 0609  LIPASE 1,230* 144* 127*   No results for input(s): AMMONIA in the last 168 hours. Coagulation Profile: No results  for input(s): INR, PROTIME in the last 168 hours. Cardiac Enzymes: No results for input(s): CKTOTAL, CKMB, CKMBINDEX, TROPONINI in the last 168 hours. BNP (last 3 results) No results for input(s): PROBNP in the last 8760 hours. HbA1C: No results for input(s): HGBA1C in the last 72 hours. CBG: Recent Labs  Lab 06/25/17 2058  GLUCAP 128*   Lipid Profile: No results for input(s): CHOL, HDL, LDLCALC, TRIG, CHOLHDL, LDLDIRECT in the last 72 hours. Thyroid Function Tests: No results for input(s): TSH, T4TOTAL, FREET4, T3FREE, THYROIDAB in the last 72 hours. Anemia Panel: No results for input(s): VITAMINB12, FOLATE, FERRITIN, TIBC, IRON, RETICCTPCT in the last 72 hours. Sepsis Labs: No results for input(s): PROCALCITON, LATICACIDVEN in the last 168 hours.  No results found for this or any previous visit (from the past 240 hour(s)).       Radiology Studies: US Abdomen Complete  Result Date: 06/27/2017 CLINICAL DATA:  Abdominal distension.  Acute pancreatitis. EXAM: ABDOMEN ULTRASOUND COMPLETE COMPARISON:  CT 06/25/2017 FINDINGS: Gallbladder: No gallstones or wall thickening visualized. No sonographic Murphy sign noted by sonographer. Common bile duct: Diameter: 3 mm common normal Liver: No focal lesion identified. Within normal limits in parenchymal echogenicity. Portal vein is patent on color Doppler imaging with normal direction of blood flow towards the liver. IVC: No abnormality visualized. Pancreas: Increased thickness of the tail consistent with acute inflammation. Spleen: Size and appearance within normal limits. Right Kidney: Length: 10.3 cm. Echogenicity within normal limits. No mass or hydronephrosis visualized. Left Kidney: Length: 11.5 cm.  1.5 cm cyst in the midportion. Abdominal aorta: Maximal diameter 2.6 cm. Other findings: Bilateral pleural effusions. Some intraperitoneal fluid adjacent to the pancreatic tail and spleen. Scattered other areas of intraperitoneal fluid. Some  fluid-filled bowel which could be secondary to ileus. IMPRESSION: Evidence of pancreatitis of the tail the pancreas with surrounding regional retroperitoneal and intraperitoneal fluid. Scattered intraperitoneal fluid elsewhere, freely distributed. No evidence of gallstones. Fluid-filled small bowel which could be secondary to ileus. Electronically Signed   By: Paulina Fusi M.D.   On: 06/27/2017 16:46        Scheduled Meds: . atorvastatin  20 mg Oral q1800  . enoxaparin (LOVENOX) injection  40 mg Subcutaneous Q24H  . folic acid  1 mg Oral Daily  . furosemide  60 mg Intravenous Once  . LORazepam  0-4 mg Intravenous Q6H   Followed by  . [START ON 06/29/2017] LORazepam  0-4 mg Intravenous Q12H  . multivitamin with minerals  1 tablet Oral Daily  . thiamine  100 mg Oral Daily   Continuous Infusions: . famotidine (PEPCID) IV 20 mg (06/28/17 1000)     LOS: 3 days    Time spent: 35 minutes    Ramiro Harvest, MD [facility] Pager [phone]  If 7PM-7AM, please contact night-coverage [email or web address] Password [facility] 06/28/2017, 12:45 PM

## 2017-06-29 ENCOUNTER — Inpatient Hospital Stay (HOSPITAL_COMMUNITY): Payer: Self-pay

## 2017-06-29 DIAGNOSIS — E876 Hypokalemia: Secondary | ICD-10-CM

## 2017-06-29 DIAGNOSIS — K56609 Unspecified intestinal obstruction, unspecified as to partial versus complete obstruction: Secondary | ICD-10-CM | POA: Clinically undetermined

## 2017-06-29 DIAGNOSIS — R509 Fever, unspecified: Secondary | ICD-10-CM

## 2017-06-29 LAB — URINALYSIS, ROUTINE W REFLEX MICROSCOPIC
BACTERIA UA: NONE SEEN
BILIRUBIN URINE: NEGATIVE
Glucose, UA: NEGATIVE mg/dL
Ketones, ur: 20 mg/dL — AB
Leukocytes, UA: NEGATIVE
Nitrite: NEGATIVE
PROTEIN: 100 mg/dL — AB
SPECIFIC GRAVITY, URINE: 1.017 (ref 1.005–1.030)
pH: 5 (ref 5.0–8.0)

## 2017-06-29 LAB — BASIC METABOLIC PANEL
Anion gap: 11 (ref 5–15)
BUN: 10 mg/dL (ref 6–20)
CALCIUM: 7.6 mg/dL — AB (ref 8.9–10.3)
CHLORIDE: 101 mmol/L (ref 101–111)
CO2: 25 mmol/L (ref 22–32)
CREATININE: 0.94 mg/dL (ref 0.61–1.24)
GFR calc non Af Amer: >60 mL/min (ref >60)
Glucose, Bld: 112 mg/dL — ABNORMAL HIGH (ref 65–99)
Potassium: 3.3 mmol/L — ABNORMAL LOW (ref 3.5–5.1)
Sodium: 137 mmol/L (ref 135–145)

## 2017-06-29 LAB — TSH: TSH: 2 u[IU]/mL (ref 0.350–4.500)

## 2017-06-29 LAB — CBC WITH DIFFERENTIAL/PLATELET
BASOS PCT: 0 %
Basophils Absolute: 0 10*3/uL (ref 0.0–0.1)
EOS ABS: 0 10*3/uL (ref 0.0–0.7)
Eosinophils Relative: 0 %
HCT: 27.7 % — ABNORMAL LOW (ref 39.0–52.0)
Hemoglobin: 9.5 g/dL — ABNORMAL LOW (ref 13.0–17.0)
LYMPHS ABS: 1.1 10*3/uL (ref 0.7–4.0)
Lymphocytes Relative: 13 %
MCH: 32.5 pg (ref 26.0–34.0)
MCHC: 34.3 g/dL (ref 30.0–36.0)
MCV: 94.9 fL (ref 78.0–100.0)
MONO ABS: 2.3 10*3/uL — AB (ref 0.1–1.0)
MONOS PCT: 28 %
Neutro Abs: 4.8 10*3/uL (ref 1.7–7.7)
Neutrophils Relative %: 59 %
Platelets: 214 10*3/uL (ref 150–400)
RBC: 2.92 MIL/uL — ABNORMAL LOW (ref 4.22–5.81)
RDW: 12.9 % (ref 11.5–15.5)
WBC: 8.3 10*3/uL (ref 4.0–10.5)

## 2017-06-29 LAB — HEPATIC FUNCTION PANEL
ALT: 50 U/L (ref 17–63)
AST: 133 U/L — AB (ref 15–41)
Albumin: 2.4 g/dL — ABNORMAL LOW (ref 3.5–5.0)
Alkaline Phosphatase: 74 U/L (ref 38–126)
BILIRUBIN DIRECT: 0.8 mg/dL — AB (ref 0.1–0.5)
BILIRUBIN INDIRECT: 0.8 mg/dL (ref 0.3–0.9)
BILIRUBIN TOTAL: 1.6 mg/dL — AB (ref 0.3–1.2)
Total Protein: 6.1 g/dL — ABNORMAL LOW (ref 6.5–8.1)

## 2017-06-29 LAB — MAGNESIUM: Magnesium: 2.3 mg/dL (ref 1.7–2.4)

## 2017-06-29 LAB — LIPASE, BLOOD: LIPASE: 161 U/L — AB (ref 11–51)

## 2017-06-29 MED ORDER — METOPROLOL TARTRATE 5 MG/5ML IV SOLN
5.0000 mg | INTRAVENOUS | Status: DC | PRN
  Administered 2017-06-30: 5 mg via INTRAVENOUS
  Filled 2017-06-29: qty 5

## 2017-06-29 MED ORDER — POTASSIUM CHLORIDE 10 MEQ/100ML IV SOLN
10.0000 meq | INTRAVENOUS | Status: AC
  Administered 2017-06-29 (×5): 10 meq via INTRAVENOUS
  Filled 2017-06-29 (×4): qty 100

## 2017-06-29 MED ORDER — KETOROLAC TROMETHAMINE 30 MG/ML IJ SOLN
30.0000 mg | Freq: Four times a day (QID) | INTRAMUSCULAR | Status: AC | PRN
  Administered 2017-06-29 – 2017-07-03 (×7): 30 mg via INTRAVENOUS
  Filled 2017-06-29 (×7): qty 1

## 2017-06-29 MED ORDER — POTASSIUM CHLORIDE 10 MEQ/100ML IV SOLN
INTRAVENOUS | Status: AC
  Administered 2017-06-29: 10 meq via INTRAVENOUS
  Filled 2017-06-29: qty 100

## 2017-06-29 MED ORDER — POTASSIUM CHLORIDE IN NACL 40-0.9 MEQ/L-% IV SOLN
INTRAVENOUS | Status: DC
Start: 2017-06-29 — End: 2017-07-03
  Administered 2017-06-29: 100 mL/h via INTRAVENOUS
  Administered 2017-06-30 (×2): 75 mL/h via INTRAVENOUS
  Administered 2017-07-01 (×2): 125 mL/h via INTRAVENOUS
  Administered 2017-07-02 – 2017-07-03 (×2): 50 mL/h via INTRAVENOUS
  Filled 2017-06-29 (×9): qty 1000

## 2017-06-29 MED ORDER — SODIUM CHLORIDE 0.9 % IV SOLN: INTRAVENOUS | Status: DC

## 2017-06-29 NOTE — Progress Notes (Signed)
PROGRESS NOTE    Arnetha CourserWilliam R Brim  VWU:[id number] DOB: Dec 07, 1970 DOA: 06/25/2017 PCP: Patient, No Pcp Per    Brief Narrative:  Tawni PummelWilliam R Morganis a 47 y.o.malewithno significant pastmedical historypresenting with abdominal pain starting 1 day prior to admission around 4 PM.  He drank a lot this weekend - all weekend. He thinks he drank about 12 beers over the weekend. +nausea, emesis 4-5 times since the pain started. He has not seen blood in his emesis. No fevers. No sick contacts. He does still have a gallbladder.   ED Course:   Denied h/o pancreatitis, occasional ETOH use.  Negative RUQ US.  Lipase >1000, AST about 100.  Abdominal CT pending.  Receiving IVF and pain meds.  Patient improved clinically in terms of his pancreatitis however continue to have distended abdomen and abdominal tightness.  Abdominal films obtained consistent with a high-grade small bowel obstruction.  Patient made n.p.o.  NG tube placed.  General surgery consulted.      Assessment & Plan:   Principal Problem:   SBO (small bowel obstruction) (HCC) Active Problems:   Acute pancreatitis   Hyperglycemia   Alcohol abuse   Penile swelling  #1 small bowel obstruction Questionable etiology.  Patient states had his appendix taken out in approximately 2008 but not quite sure of the exact year but was when Obama became president.  Concern for complication of pancreatitis however lipase levels have trended down.  LFTs have improved.  Will repeat hepatic panel this morning.  Keep potassium greater than 4.  Keep magnesium greater than 2.  Check an acute abdominal series.  N.p.o.  Place NG tube to low intermittent suction.  Consultation with general surgery.  Follow.  2 acute pancreatitis Likely secondary to alcohol use.  Patient presented abdominal pain nausea and emesis.  Lipase levels elevated at 1230 with a AST of 133 and ALT of 56 on presentation.  CT abdomen and pelvis done with severe pancreatitis of  the distal body and pancreatic tail with large amount of fluid around the distal pancreas.  Areas of hypo-enhancement in the distal body of the pancreas concerning for pancreatic necrosis.  CT abdomen and pelvis negative for gallstones or thickened gallbladder wall.  Triglycerides of 233.  Patient with clinical improvement.  LFTs trending down.  Lipase pending this morning however was at 127 yesterday.  Patient now has developed a high-grade small bowel obstruction and as such we will make patient n.p.o.  Place NG tube.  General surgical consultation.  3.  Alcohol abuse Alcohol cessation was stressed to patient.  Due to abdominal distention abdominal ultrasound was obtained which was negative for ascites however consistent with pancreatitis, fluid filled small bowel, scattered intraperitoneal fluid, no evidence of gallstones.  Continue ativan withdrawal protocol.  Follow.  4.  Hypokalemia Replete.  Keep potassium greater than 4.  Magnesium level is 2.3.  We will give some rounds of IV potassium and place potassium in IV fluids.   5.  Hyperlycemia Likely reactive.  Hemoglobin A1c of 4.8.  Follow.   6 penile swelling Question onset.  Patient is circumcised.  Denies any pain.  No discharge.  Patient is currently afebrile.  Able to urinate without any problems.  No dysuria.  Will need outpatient follow-up with urology.   7.  Abdominal distention Secondary to small bowel obstruction.  See #1.  8.  Low-grade fever Patient noted to have a low-grade temperature of 100.8 this morning.  Check blood cultures x2.  Check a UA with  cultures and sensitivities.  Check a chest x-ray.  WBC normal.  Hold off on antibiotics at this time.  Follow.     DVT prophylaxis: Lovenox Code Status: Full Family Communication: Updated patient.  No family at bedside.   Disposition Plan: Home when clinically improved, resolution of small bowel obstruction and per general surgery.    Consultants:   Neurosurgery  pending  Procedures:  CT abdomen and pelvis 06/25/2017  Abdominal ultrasound 06/27/2017  [facility] ultrasound 06/25/2017  Abdominal x-rays 06/28/2017  Antimicrobials:  None   Subjective: Patient noted to have low-grade fevers overnight.  Patient complained of abdominal discomfort.  Passing some flatus yesterday but no significant bowel movement.  Burping.  Complaint of abdominal tightness.  No chest pain.  No shortness of breath.  Still with some penile swelling.    Objective: Vitals:   06/28/17 1800 06/28/17 2038 06/29/17 0435 06/29/17 0802  BP: (!) 136/92 124/86 (!) 147/94 (!) 143/87  Pulse:  (!) 114 (!) 133 (!) 132  Resp:  16  (!) 21  Temp: 99.8 F (37.7 C) 99.8 F (37.7 C) 99.4 F (37.4 C) (!) 100.9 F (38.3 C)  TempSrc: Oral Oral Oral Oral  SpO2: 92% 91% 90% 92%  Weight:      Height:        Intake/Output Summary (Last 24 hours) at 06/29/2017 0902 Last data filed at 06/29/2017 0524 Gross per 24 hour  Intake 1420 ml  Output 3100 ml  Net -1680 ml   Filed Weights   06/25/17 0449  Weight: 77.1 kg (170 lb)    Examination:  General exam: Appears calm and comfortable  Respiratory system: Clear to auscultation bilaterally.  No wheezes, no crackles, no rhonchi.  Respiratory effort normal. Cardiovascular system: Tachycardia.  No JVD, no murmurs, no rubs.  No lower extremity edema.  Gastrointestinal system: Abdomen is distended, tight, some diffuse tenderness to palpation, hypoactive bowel sounds.  No rebound.  No guarding.   Central nervous system: Alert and oriented. No focal neurological deficits. Extremities: Symmetric 5 x 5 power.  Genitourinary: Patient with less swollen shaft of penis, nontender, no drainage, circumcised, no warmth, no erythema. Skin: No rashes, lesions or ulcers Psychiatry: Judgement and insight appear normal. Mood & affect appropriate.     Data Reviewed: I have personally reviewed following labs and imaging studies  CBC: Recent Labs  Lab  06/25/17 0512 06/26/17 0629 06/27/17 0625 06/28/17 0609 06/29/17 0321  WBC 11.7* 6.0 7.6 7.8 8.3  NEUTROABS  --   --  5.5  --  4.8  HGB 12.6* 14.4 11.6* 10.0* 9.5*  HCT 35.4* 39.4 33.4* 29.1* 27.7*  MCV 95.7 95.6 96.5 95.4 94.9  PLT 238 209 185 182 214   Basic Metabolic Panel: Recent Labs  Lab 06/25/17 0512 06/26/17 0629 06/27/17 0625 06/28/17 0609 06/29/17 0321  NA 134* 139 136 140 137  K 3.9 3.3* 3.1* 3.4* 3.3*  CL 98* 103 103 107 101  CO2 25 26 25 26 25   GLUCOSE 119* 134* 142* 123* 112*  BUN 8 8 10 8 10   CREATININE 0.82 1.11 1.00 0.97 0.94  CALCIUM 8.7* 6.9* 6.5* 6.8* 7.6*  MG  --   --  2.0  --  2.3   GFR: Estimated Creatinine Clearance: 96.1 mL/min (by C-G formula based on SCr of 0.94 mg/dL). Liver Function Tests: Recent Labs  Lab 06/25/17 0512 06/26/17 0629 06/28/17 0609  AST 133* 47* 83*  ALT 56 28 35  ALKPHOS 58 36* 50  BILITOT 1.9*  1.5* 1.4*  PROT 6.2* 5.7* 6.0*  ALBUMIN 3.5 2.7* 2.4*   Recent Labs  Lab 06/25/17 0512 06/27/17 0625 06/28/17 0609  LIPASE 1,230* 144* 127*   No results for input(s): AMMONIA in the last 168 hours. Coagulation Profile: No results for input(s): INR, PROTIME in the last 168 hours. Cardiac Enzymes: No results for input(s): CKTOTAL, CKMB, CKMBINDEX, TROPONINI in the last 168 hours. BNP (last 3 results) No results for input(s): PROBNP in the last 8760 hours. HbA1C: No results for input(s): HGBA1C in the last 72 hours. CBG: Recent Labs  Lab 06/25/17 2058  GLUCAP 128*   Lipid Profile: No results for input(s): CHOL, HDL, LDLCALC, TRIG, CHOLHDL, LDLDIRECT in the last 72 hours. Thyroid Function Tests: Recent Labs    06/29/17 0321  TSH 2.000   Anemia Panel: No results for input(s): VITAMINB12, FOLATE, FERRITIN, TIBC, IRON, RETICCTPCT in the last 72 hours. Sepsis Labs: No results for input(s): PROCALCITON, LATICACIDVEN in the last 168 hours.  No results found for this or any previous visit (from the past 240  hour(s)).       Radiology Studies: US Abdomen Complete  Result Date: 06/27/2017 CLINICAL DATA:  Abdominal distension.  Acute pancreatitis. EXAM: ABDOMEN ULTRASOUND COMPLETE COMPARISON:  CT 06/25/2017 FINDINGS: Gallbladder: No gallstones or wall thickening visualized. No sonographic Murphy sign noted by sonographer. Common bile duct: Diameter: 3 mm common normal Liver: No focal lesion identified. Within normal limits in parenchymal echogenicity. Portal vein is patent on color Doppler imaging with normal direction of blood flow towards the liver. IVC: No abnormality visualized. Pancreas: Increased thickness of the tail consistent with acute inflammation. Spleen: Size and appearance within normal limits. Right Kidney: Length: 10.3 cm. Echogenicity within normal limits. No mass or hydronephrosis visualized. Left Kidney: Length: 11.5 cm.  1.5 cm cyst in the midportion. Abdominal aorta: Maximal diameter 2.6 cm. Other findings: Bilateral pleural effusions. Some intraperitoneal fluid adjacent to the pancreatic tail and spleen. Scattered other areas of intraperitoneal fluid. Some fluid-filled bowel which could be secondary to ileus. IMPRESSION: Evidence of pancreatitis of the tail the pancreas with surrounding regional retroperitoneal and intraperitoneal fluid. Scattered intraperitoneal fluid elsewhere, freely distributed. No evidence of gallstones. Fluid-filled small bowel which could be secondary to ileus. Electronically Signed   By: Paulina Fusi M.D.   On: 06/27/2017 16:46   Dg Abd 2 Views  Result Date: 06/28/2017 CLINICAL DATA:  Abdominal distension, pain and swelling. EXAM: ABDOMEN - 2 VIEW COMPARISON:  None. FINDINGS: Diffusely dilated gas-filled small bowel loops throughout the abdomen, with associated air-fluid levels, consistent with small-bowel obstruction. Only a small amount of questionable colonic gas is seen in the LEFT abdomen, suggesting complete versus high-grade partial small bowel obstruction.  No evidence of free intraperitoneal air seen. No evidence of renal or ureteral calculi. Osseous structures are unremarkable. IMPRESSION: Small-bowel obstruction, with significantly dilated gas-filled loops of small bowel throughout the abdomen, with associated air-fluid levels. This is most compatible with complete versus high-grade partial small bowel obstruction, less likely ileus. These results will be called to the ordering clinician or representative by the Radiologist Assistant, and communication documented in the PACS or zVision Dashboard. Electronically Signed   By: Bary Richard M.D.   On: 06/28/2017 20:40        Scheduled Meds: . atorvastatin  20 mg Oral q1800  . enoxaparin (LOVENOX) injection  40 mg Subcutaneous Q24H  . folic acid  1 mg Oral Daily  . LORazepam  0-4 mg Intravenous Q6H   Followed  by  . LORazepam  0-4 mg Intravenous Q12H  . multivitamin with minerals  1 tablet Oral Daily  . simethicone  160 mg Oral QID  . thiamine  100 mg Oral Daily   Continuous Infusions: . 0.9 % NaCl with KCl 40 mEq / L    . famotidine (PEPCID) IV 20 mg (06/28/17 2142)  . potassium chloride 10 mEq (06/29/17 0828)     LOS: 4 days    Time spent: 40 minutes    Ramiro Harvest, MD [facility] Pager [phone]  If 7PM-7AM, please contact night-coverage [email or web address] Password [email or web address] 06/29/2017, 9:02 AM

## 2017-06-29 NOTE — Consult Note (Signed)
Reason for Consult:SBO Referring Physician: Raekwan Spelman is an 47 y.o. male.  HPI: Ivan Harper was admitted with alcoholic pancreatitis on 8/67. He is being treated medically. He developed worsening abdominal distention and abdominal X-rays last night showed small bowel obstruction.  He reports feeling very distended but only having mild left upper quadrant pain.  He had a bowel movement this a.m.  He is status post laparoscopic appendectomy in 2008.  History reviewed. No pertinent past medical history.  Past Surgical History:  Procedure Laterality Date  . APPENDECTOMY      Family History  Problem Relation Age of Onset  . Pancreatic disease Neg Hx     Social History:  reports that he has never smoked. He has never used smokeless tobacco. He reports that he drinks alcohol. He reports that he does not use drugs.  Allergies: No Known Allergies  Medications: I have reviewed the patient's current medications.  Results for orders placed or performed during the hospital encounter of 06/25/17 (from the past 48 hour(s))  Comprehensive metabolic panel     Status: Abnormal   Collection Time: 06/28/17  6:09 AM  Result Value Ref Range   Sodium 140 135 - 145 mmol/L   Potassium 3.4 (L) 3.5 - 5.1 mmol/L   Chloride 107 101 - 111 mmol/L   CO2 26 22 - 32 mmol/L   Glucose, Bld 123 (H) 65 - 99 mg/dL   BUN 8 6 - 20 mg/dL   Creatinine, Ser 0.97 0.61 - 1.24 mg/dL   Calcium 6.8 (L) 8.9 - 10.3 mg/dL   Total Protein 6.0 (L) 6.5 - 8.1 g/dL   Albumin 2.4 (L) 3.5 - 5.0 g/dL   AST 83 (H) 15 - 41 U/L   ALT 35 17 - 63 U/L   Alkaline Phosphatase 50 38 - 126 U/L   Total Bilirubin 1.4 (H) 0.3 - 1.2 mg/dL   GFR calc non Af Amer >60 >60 mL/min   GFR calc Af Amer >60 >60 mL/min    Comment: (NOTE) The eGFR has been calculated using the CKD EPI equation. This calculation has not been validated in all clinical situations. eGFR's persistently <60 mL/min signify possible Chronic  Kidney Disease.    Anion gap 7 5 - 15    Comment: Performed at [facility] [address], [place], [place] [address]  CBC     Status: Abnormal   Collection Time: 06/28/17  6:09 AM  Result Value Ref Range   WBC 7.8 4.0 - 10.5 K/uL   RBC 3.05 (L) 4.22 - 5.81 MIL/uL   Hemoglobin 10.0 (L) 13.0 - 17.0 g/dL   HCT 29.1 (L) 39.0 - 52.0 %   MCV 95.4 78.0 - 100.0 fL   MCH 32.8 26.0 - 34.0 pg   MCHC 34.4 30.0 - 36.0 g/dL   RDW 12.7 11.5 - 15.5 %   Platelets 182 150 - 400 K/uL    Comment: Performed at [facility], [place] [address], [place], [place] [address]  Lipase, blood     Status: Abnormal   Collection Time: 06/28/17  6:09 AM  Result Value Ref Range   Lipase 127 (H) 11 - 51 U/L    Comment: Performed at [facility], [place] [address], [place], [place] [address]  TSH     Status: None   Collection Time: 06/29/17  3:21 AM  Result Value Ref Range   TSH 2.000 0.350 - 4.500 uIU/mL    Comment:  Performed by a 3rd Generation assay with a functional sensitivity of <=0.01 uIU/mL. Performed at [facility], [place] [address], [place], [place] [address]   CBC with Differential/Platelet     Status: Abnormal   Collection Time: 06/29/17  3:21 AM  Result Value Ref Range   WBC 8.3 4.0 - 10.5 K/uL   RBC 2.92 (L) 4.22 - 5.81 MIL/uL   Hemoglobin 9.5 (L) 13.0 - 17.0 g/dL   HCT 27.7 (L) 39.0 - 52.0 %   MCV 94.9 78.0 - 100.0 fL   MCH 32.5 26.0 - 34.0 pg   MCHC 34.3 30.0 - 36.0 g/dL   RDW 12.9 11.5 - 15.5 %   Platelets 214 150 - 400 K/uL   Neutrophils Relative % 59 %   Neutro Abs 4.8 1.7 - 7.7 K/uL   Lymphocytes Relative 13 %   Lymphs Abs 1.1 0.7 - 4.0 K/uL   Monocytes Relative 28 %   Monocytes Absolute 2.3 (H) 0.1 - 1.0 K/uL   Eosinophils Relative 0 %   Eosinophils Absolute 0.0 0.0 - 0.7 K/uL   Basophils Relative 0 %   Basophils Absolute 0.0 0.0 - 0.1 K/uL    Comment: Performed at [facility] [address] [place], [place] [address]  Basic metabolic panel      Status: Abnormal   Collection Time: 06/29/17  3:21 AM  Result Value Ref Range   Sodium 137 135 - 145 mmol/L   Potassium 3.3 (L) 3.5 - 5.1 mmol/L   Chloride 101 101 - 111 mmol/L   CO2 25 22 - 32 mmol/L   Glucose, Bld 112 (H) 65 - 99 mg/dL   BUN 10 6 - 20 mg/dL   Creatinine, Ser 0.94 0.61 - 1.24 mg/dL   Calcium 7.6 (L) 8.9 - 10.3 mg/dL   GFR calc non Af Amer >60 >60 mL/min   GFR calc Af Amer >60 >60 mL/min    Comment: (NOTE) The eGFR has been calculated using the CKD EPI equation. This calculation has not been validated in all clinical situations. eGFR's persistently <60 mL/min signify possible Chronic Kidney Disease.    Anion gap 11 5 - 15    Comment: Performed at [facility] [address] [place], [place] [address]  Magnesium     Status: None   Collection Time: 06/29/17  3:21 AM  Result Value Ref Range   Magnesium 2.3 1.7 - 2.4 mg/dL    Comment: Performed at [facility] [address], [place], [place] [address]  Lipase, blood     Status: Abnormal   Collection Time: 06/29/17  3:21 AM  Result Value Ref Range   Lipase 161 (H) 11 - 51 U/L    Comment: Performed at [facility], [place] [address], [place], [place] [address]  Hepatic function panel     Status: Abnormal   Collection Time: 06/29/17  3:21 AM  Result Value Ref Range   Total Protein 6.1 (L) 6.5 - 8.1 g/dL   Albumin 2.4 (L) 3.5 - 5.0 g/dL   AST 133 (H) 15 - 41 U/L   ALT 50 17 - 63 U/L   Alkaline Phosphatase 74 38 - 126 U/L   Total Bilirubin 1.6 (H) 0.3 - 1.2 mg/dL   Bilirubin, Direct 0.8 (H) 0.1 - 0.5 mg/dL   Indirect Bilirubin 0.8 0.3 - 0.9 mg/dL    Comment: Performed at [facility] [address] [place],  [address]  US Abdomen Complete  Result Date: 06/27/2017 CLINICAL DATA:  Abdominal distension.  Acute pancreatitis. EXAM: ABDOMEN ULTRASOUND COMPLETE COMPARISON:  CT 06/25/2017 FINDINGS: Gallbladder: No gallstones or wall thickening visualized. No sonographic Murphy sign  noted by sonographer. Common bile duct: Diameter: 3 mm common normal Liver: No focal lesion identified. Within normal limits in parenchymal echogenicity. Portal vein is patent on color Doppler imaging with normal direction of blood flow towards the liver. IVC: No abnormality visualized. Pancreas: Increased thickness of the tail consistent with acute inflammation. Spleen: Size and appearance within normal limits. Right Kidney: Length: 10.3 cm. Echogenicity within normal limits. No mass or hydronephrosis visualized. Left Kidney: Length: 11.5 cm.  1.5 cm cyst in the midportion. Abdominal aorta: Maximal diameter 2.6 cm. Other findings: Bilateral pleural effusions. Some intraperitoneal fluid adjacent to the pancreatic tail and spleen. Scattered other areas of intraperitoneal fluid. Some fluid-filled bowel which could be secondary to ileus. IMPRESSION: Evidence of pancreatitis of the tail the pancreas with surrounding regional retroperitoneal and intraperitoneal fluid. Scattered intraperitoneal fluid elsewhere, freely distributed. No evidence of gallstones. Fluid-filled small bowel which could be secondary to ileus. Electronically Signed   By: Nelson Chimes M.D.   On: 06/27/2017 16:46   Dg Abd 2 Views  Result Date: 06/28/2017 CLINICAL DATA:  Abdominal distension, pain and swelling. EXAM: ABDOMEN - 2 VIEW COMPARISON:  None. FINDINGS: Diffusely dilated gas-filled small bowel loops throughout the abdomen, with associated air-fluid levels, consistent with small-bowel obstruction. Only a small amount of questionable colonic gas is seen in the LEFT abdomen, suggesting complete versus high-grade partial small bowel obstruction. No evidence of free intraperitoneal air seen. No evidence of renal or ureteral calculi. Osseous structures are unremarkable. IMPRESSION: Small-bowel obstruction, with significantly dilated gas-filled loops of small bowel throughout the abdomen, with associated air-fluid levels. This is most compatible  with complete versus high-grade partial small bowel obstruction, less likely ileus. These results will be called to the ordering clinician or representative by the Radiologist Assistant, and communication documented in the PACS or zVision Dashboard. Electronically Signed   By: Franki Cabot M.D.   On: 06/28/2017 20:40    Review of Systems  Constitutional: Positive for fever and malaise/fatigue. Negative for chills.  HENT: Negative.   Eyes: Negative.   Respiratory: Negative for cough and shortness of breath.   Cardiovascular: Negative for chest pain.  Gastrointestinal: Positive for abdominal pain and nausea. Negative for blood in stool and vomiting.       Abdominal distention  Genitourinary: Negative.   Musculoskeletal: Negative.   Skin: Negative.   Neurological: Negative.   Endo/Heme/Allergies: Negative.   Psychiatric/Behavioral: Negative.    Blood pressure (!) 143/87, pulse (!) 132, temperature (!) 100.9 F (38.3 C), temperature source Oral, resp. rate (!) 21, height _0  (1.753 m), weight 77.1 kg (170 lb), SpO2 92 %. Physical Exam  Constitutional: He is oriented to person, place, and time. He appears well-developed and well-nourished. No distress.  HENT:  Head: Normocephalic.  Right Ear: External ear normal.  Left Ear: External ear normal.  Mouth/Throat: Oropharynx is clear and moist.  Eyes: Pupils are equal, round, and reactive to light.  Neck: Neck supple. No thyromegaly present.  Cardiovascular: Normal rate and regular rhythm.  Respiratory: Effort normal and breath sounds normal. No respiratory distress. He has no wheezes. He has no rales.  GI: Soft. He exhibits distension. There is tenderness. There is no rebound and no guarding.  Minimal lateral left upper quadrant and left flank tenderness, no other tenderness,  no peritonitis  Musculoskeletal: Normal range of motion.  Neurological: He is alert and oriented to person, place, and time.  Skin: Skin is warm.  Psychiatric: He  has a normal mood and affect.    Assessment/Plan: Alcoholic pancreatitis - continue medical management Abdominal distention is likely an ileus secondary to above, doubt SBO.  Agree with NG tube and IV fluids.  This should resolve as his pancreatitis improves.  Follow-up abdominal series is pending.  We will follow along.  Zenovia Jarred 06/29/2017, 10:00 AM

## 2017-06-30 ENCOUNTER — Inpatient Hospital Stay (HOSPITAL_COMMUNITY): Payer: Self-pay

## 2017-06-30 DIAGNOSIS — R140 Abdominal distension (gaseous): Secondary | ICD-10-CM

## 2017-06-30 DIAGNOSIS — K567 Ileus, unspecified: Secondary | ICD-10-CM | POA: Diagnosis present

## 2017-06-30 LAB — CBC WITH DIFFERENTIAL/PLATELET
BASOS ABS: 0 10*3/uL (ref 0.0–0.1)
BASOS PCT: 0 %
EOS ABS: 0.1 10*3/uL (ref 0.0–0.7)
EOS PCT: 1 %
HCT: 27.3 % — ABNORMAL LOW (ref 39.0–52.0)
Hemoglobin: 9.5 g/dL — ABNORMAL LOW (ref 13.0–17.0)
LYMPHS PCT: 11 %
Lymphs Abs: 1.1 10*3/uL (ref 0.7–4.0)
MCH: 32.5 pg (ref 26.0–34.0)
MCHC: 34.8 g/dL (ref 30.0–36.0)
MCV: 93.5 fL (ref 78.0–100.0)
MONO ABS: 1.8 10*3/uL — AB (ref 0.1–1.0)
Monocytes Relative: 20 %
Neutro Abs: 6.4 10*3/uL (ref 1.7–7.7)
Neutrophils Relative %: 68 %
PLATELETS: 263 10*3/uL (ref 150–400)
RBC: 2.92 MIL/uL — AB (ref 4.22–5.81)
RDW: 13.5 % (ref 11.5–15.5)
WBC: 9.4 10*3/uL (ref 4.0–10.5)

## 2017-06-30 LAB — URINE CULTURE: CULTURE: NO GROWTH

## 2017-06-30 LAB — COMPREHENSIVE METABOLIC PANEL
ALK PHOS: 74 U/L (ref 38–126)
ALT: 43 U/L (ref 17–63)
AST: 81 U/L — AB (ref 15–41)
Albumin: 2.3 g/dL — ABNORMAL LOW (ref 3.5–5.0)
Anion gap: 12 (ref 5–15)
BILIRUBIN TOTAL: 3.8 mg/dL — AB (ref 0.3–1.2)
BUN: 13 mg/dL (ref 6–20)
CALCIUM: 8.2 mg/dL — AB (ref 8.9–10.3)
CO2: 25 mmol/L (ref 22–32)
CREATININE: 1.09 mg/dL (ref 0.61–1.24)
Chloride: 103 mmol/L (ref 101–111)
GFR calc Af Amer: >60 mL/min (ref >60)
GLUCOSE: 102 mg/dL — AB (ref 65–99)
POTASSIUM: 3.6 mmol/L (ref 3.5–5.1)
Sodium: 140 mmol/L (ref 135–145)
TOTAL PROTEIN: 6.3 g/dL — AB (ref 6.5–8.1)

## 2017-06-30 LAB — LIPASE, BLOOD: LIPASE: 352 U/L — AB (ref 11–51)

## 2017-06-30 MED ORDER — POTASSIUM CHLORIDE 10 MEQ/100ML IV SOLN
10.0000 meq | INTRAVENOUS | Status: AC
  Administered 2017-06-30 (×4): 10 meq via INTRAVENOUS
  Filled 2017-06-30 (×4): qty 100

## 2017-06-30 MED ORDER — SODIUM CHLORIDE 0.9 % IV BOLUS
1000.0000 mL | Freq: Once | INTRAVENOUS | Status: AC
  Administered 2017-06-30: 1000 mL via INTRAVENOUS

## 2017-06-30 NOTE — Progress Notes (Addendum)
PROGRESS NOTE    Ivan Harper  ZOX:[id number] DOB: October 11, 1970 DOA: 06/25/2017 PCP: Patient, No Pcp Per    Brief Narrative:  Ivan Harper a 47 y.o.malewithno significant pastmedical historypresenting with abdominal pain starting 1 day prior to admission around 4 PM.  He drank a lot this weekend - all weekend. He thinks he drank about 12 beers over the weekend. +nausea, emesis 4-5 times since the pain started. He has not seen blood in his emesis. No fevers. No sick contacts. He does still have a gallbladder.   ED Course:   Denied h/o pancreatitis, occasional ETOH use.  Negative RUQ [place].  Lipase >1000, AST about 100.  Abdominal CT pending.  Receiving IVF and pain meds.  Patient improved clinically in terms of his pancreatitis however continue to have distended abdomen and abdominal tightness.  Abdominal films obtained consistent with a high-grade small bowel obstruction.  Patient made n.p.o.  NG tube placed.  General surgery consulted.      Assessment & Plan:   Principal Problem:   Ileus, unspecified (HCC) Active Problems:   Acute pancreatitis   Hyperglycemia   Alcohol abuse   Penile swelling   SBO (small bowel obstruction) (HCC)   Fever   Hypokalemia  #1  Ileus secondary to acute pancreatitis versus small bowel obstruction Patient with ileus likely complication from his acute pancreatitis.  Patient states had his appendix taken out in approximately 2008 but not quite sure of the exact year but was when Obama became president.  Concern for complication of pancreatitis lipase levels trending back up.  LFTs trending down.  Give a liter bolus of normal saline and increase IV fluids to 125 cc/h.  Continue to keep n.p.o.  Continue NG tube.  Serial abdominal films.  General surgery has been consulted and are following and I appreciate their input and recommendations.   2 acute pancreatitis Likely secondary to alcohol use.  Patient presented abdominal pain nausea and  emesis.  Lipase levels elevated at 1230 with a AST of 133 and ALT of 56 on presentation.  CT abdomen and pelvis done with severe pancreatitis of the distal body and pancreatic tail with large amount of fluid around the distal pancreas.  Areas of hypo-enhancement in the distal body of the pancreas concerning for pancreatic necrosis.  CT abdomen and pelvis negative for gallstones or thickened gallbladder wall.  Triglycerides of 233.  Patient with clinical improvement.  LFTs trending down.  Lipase of 352 from 161, 127.  Patient now has developed likely ileus versus high-grade small bowel obstruction.  Patient made n.p.o.  NG tube has been placed.  Continue hydration with IV fluids.  General surgery has been consulted and are following.  Supportive care.   3.  Alcohol abuse Alcohol cessation was stressed to patient.  Due to abdominal distention abdominal ultrasound was obtained which was negative for ascites however consistent with pancreatitis, fluid filled small bowel, scattered intraperitoneal fluid, no evidence of gallstones.  No evidence of cirrhosis.  Patient with no signs of withdrawal.  Continue ativan withdrawal protocol.  Follow.  4.  Hypokalemia Replete.  Keep potassium greater than 4.  Magnesium level is 2.3.  We will give IV KCl x4 rounds.   5.  Hyperlycemia Likely reactive.  Hemoglobin A1c of 4.8.  Follow.   6 penile swelling Question onset.  Patient is circumcised.  Denies any pain.  No discharge.  Patient is currently afebrile.  Able to urinate without any problems.  No dysuria.  Penile swelling  improving.  Follow.  If continued swelling will need outpatient follow-up with urology.   7.  Abdominal distention Secondary to small bowel obstruction.  See #1.  8.  Low-grade fever Patient noted to have a low-grade temperature of 100.8 morning of 06/29/2017.  Blood cultures with no growth to date.  Patient with no productive cough or respiratory symptoms.  Urinalysis is nitrite negative,  leukocytes negative.  Fever curve trending down.  Normal white count.  Hold off on antibiotics at this time.  9.  Sinus tachycardia Likely secondary to problems #1 and 2.  Give a fluid bolus.  Increase IV fluids to 125 cc/h.  IV Lopressor as needed heart rate greater than 130.     DVT prophylaxis: Lovenox Code Status: Full Family Communication: Updated patient and wife at bedside. Disposition Plan: Home when clinically improved, resolution of ileus versus small bowel obstruction and per general surgery.    Consultants:   General surgery: Dr. Violeta Gelinas 06/29/2017  Procedures:  CT abdomen and pelvis 06/25/2017  Abdominal ultrasound 06/27/2017  [facility] ultrasound 06/25/2017  Abdominal x-rays 06/28/2017, 06/29/2017, 06/30/2017  Antimicrobials:  None   Subjective: Patient in bed with some abdominal discomfort.  No nausea or emesis.  States passing some flatus.  Patient states had small bowel movement.  No chest pain.  No shortness of breath.   Objective: Vitals:   06/29/17 2004 06/30/17 0522 06/30/17 0822 06/30/17 1628  BP: (!) 149/95 (!) 155/98 (!) 141/101 (!) 146/94  Pulse: (!) 127 (!) 127 (!) 127 (!) 136  Resp: 20 20 18 20   Temp: 99.8 F (37.7 C) 99.8 F (37.7 C) 98.4 F (36.9 C) 97.9 F (36.6 C)  TempSrc: Oral Oral Oral Oral  SpO2: 91% 90% 90% 92%  Weight: 75.8 kg (167 lb 3.2 oz)     Height:        Intake/Output Summary (Last 24 hours) at 06/30/2017 1632 Last data filed at 06/30/2017 1400 Gross per 24 hour  Intake 2775.83 ml  Output 1100 ml  Net 1675.83 ml   Filed Weights   06/25/17 0449 06/29/17 2004  Weight: 77.1 kg (170 lb) 75.8 kg (167 lb 3.2 oz)    Examination:  General exam: Appears calm and comfortable.  NG tube in place Respiratory system: Clear to auscultation bilaterally.  Decreased breath sounds in the bases.  No crackles, no rhonchi, no wheezing.  Respiratory effort normal. Cardiovascular system: Tachycardia.  No JVD, no murmurs, no rubs.   No lower extremity edema.  Gastrointestinal system: Abdomen is slightly less distended, less tight, some diffuse tenderness to palpation, hypoactive bowel sounds.  No rebound.  No guarding.   Central nervous system: Alert and oriented. No focal neurological deficits. Extremities: Symmetric 5 x 5 power.  Genitourinary: Patient with decreased swelling in the shaft of the penis, nontender, no drainage, circumcised, no warmth, no erythema.  Skin: No rashes, lesions or ulcers Psychiatry: Judgement and insight appear normal. Mood & affect appropriate.     Data Reviewed: I have personally reviewed following labs and imaging studies  CBC: Recent Labs  Lab 06/26/17 0629 06/27/17 0625 06/28/17 0609 06/29/17 0321 06/30/17 0556  WBC 6.0 7.6 7.8 8.3 9.4  NEUTROABS  --  5.5  --  4.8 6.4  HGB 14.4 11.6* 10.0* 9.5* 9.5*  HCT 39.4 33.4* 29.1* 27.7* 27.3*  MCV 95.6 96.5 95.4 94.9 93.5  PLT 209 185 182 214 263   Basic Metabolic Panel: Recent Labs  Lab 06/26/17 0629 06/27/17 0625 06/28/17 0609 06/29/17 0321  06/30/17 0556  NA 139 136 140 137 140  K 3.3* 3.1* 3.4* 3.3* 3.6  CL 103 103 107 101 103  CO2 26 25 26 25 25   GLUCOSE 134* 142* 123* 112* 102*  BUN 8 10 8 10 13   CREATININE 1.11 1.00 0.97 0.94 1.09  CALCIUM 6.9* 6.5* 6.8* 7.6* 8.2*  MG  --  2.0  --  2.3  --    GFR: Estimated Creatinine Clearance: 82.9 mL/min (by C-G formula based on SCr of 1.09 mg/dL). Liver Function Tests: Recent Labs  Lab 06/25/17 0512 06/26/17 0629 06/28/17 0609 06/29/17 0321 06/30/17 0556  AST 133* 47* 83* 133* 81*  ALT 56 28 35 50 43  ALKPHOS 58 36* 50 74 74  BILITOT 1.9* 1.5* 1.4* 1.6* 3.8*  PROT 6.2* 5.7* 6.0* 6.1* 6.3*  ALBUMIN 3.5 2.7* 2.4* 2.4* 2.3*   Recent Labs  Lab 06/25/17 0512 06/27/17 0625 06/28/17 0609 06/29/17 0321 06/30/17 0556  LIPASE 1,230* 144* 127* 161* 352*   No results for input(s): AMMONIA in the last 168 hours. Coagulation Profile: No results for input(s): INR,  PROTIME in the last 168 hours. Cardiac Enzymes: No results for input(s): CKTOTAL, CKMB, CKMBINDEX, TROPONINI in the last 168 hours. BNP (last 3 results) No results for input(s): PROBNP in the last 8760 hours. HbA1C: No results for input(s): HGBA1C in the last 72 hours. CBG: Recent Labs  Lab 06/25/17 2058  GLUCAP 128*   Lipid Profile: No results for input(s): CHOL, HDL, LDLCALC, TRIG, CHOLHDL, LDLDIRECT in the last 72 hours. Thyroid Function Tests: Recent Labs    06/29/17 0321  TSH 2.000   Anemia Panel: No results for input(s): VITAMINB12, FOLATE, FERRITIN, TIBC, IRON, RETICCTPCT in the last 72 hours. Sepsis Labs: No results for input(s): PROCALCITON, LATICACIDVEN in the last 168 hours.  Recent Results (from the past 240 hour(s))  Culture, blood (Routine X 2) w Reflex to ID Panel     Status: None (Preliminary result)   Collection Time: 06/29/17  9:01 AM  Result Value Ref Range Status   Specimen Description BLOOD LEFT ANTECUBITAL  Final   Special Requests   Final    BOTTLES DRAWN AEROBIC AND ANAEROBIC Blood Culture adequate volume   Culture   Final    NO GROWTH 1 DAY Performed at [facility], [address], [place], [place] [address]    Report Status PENDING  Incomplete  Culture, blood (Routine X 2) w Reflex to ID Panel     Status: None (Preliminary result)   Collection Time: 06/29/17  9:06 AM  Result Value Ref Range Status   Specimen Description BLOOD LEFT HAND  Final   Special Requests   Final    BOTTLES DRAWN AEROBIC ONLY Blood Culture adequate volume   Culture   Final    NO GROWTH 1 DAY Performed at [facility], [address], [place], [place] [address]    Report Status PENDING  Incomplete  Urine Culture     Status: None   Collection Time: 06/29/17 11:28 AM  Result Value Ref Range Status   Specimen Description URINE, RANDOM  Final   Special Requests NONE  Final   Culture   Final    NO GROWTH Performed at [facility], [address] [place], [place] [address]    Report Status 06/30/2017 FINAL  Final         Radiology Studies: Dg Abd 2 Views  Result Date: 06/30/2017 CLINICAL DATA:  Encounter  for small bowel obstruction EXAM: ABDOMEN - 2 VIEW COMPARISON:  the previous day's study FINDINGS: Consolidation/atelectasis in the visualized left lower lung with probable effusion. No free air. Nasogastric tube extends into the decompressed stomach. Multiple dilated small bowel loops in the mid abdomen with fluid levels on the erect radiograph. The colon is decompressed. Regional bones unremarkable. IMPRESSION: 1. Persistent small bowel obstruction, nasogastric tube in the stomach. 2. No free air. Electronically Signed   By: Corlis Leak  Hassell M.D.   On: 06/30/2017 09:48   Dg Abd 2 Views  Result Date: 06/28/2017 CLINICAL DATA:  Abdominal distension, pain and swelling. EXAM: ABDOMEN - 2 VIEW COMPARISON:  None. FINDINGS: Diffusely dilated gas-filled small bowel loops throughout the abdomen, with associated air-fluid levels, consistent with small-bowel obstruction. Only a small amount of questionable colonic gas is seen in the LEFT abdomen, suggesting complete versus high-grade partial small bowel obstruction. No evidence of free intraperitoneal air seen. No evidence of renal or ureteral calculi. Osseous structures are unremarkable. IMPRESSION: Small-bowel obstruction, with significantly dilated gas-filled loops of small bowel throughout the abdomen, with associated air-fluid levels. This is most compatible with complete versus high-grade partial small bowel obstruction, less likely ileus. These results will be called to the ordering clinician or representative by the Radiologist Assistant, and communication documented in the PACS or zVision Dashboard. Electronically Signed   By: Bary RichardStan  Maynard M.D.   On: 06/28/2017 20:40   Dg Abd Acute W/chest  Result Date: 06/29/2017 CLINICAL DATA:  Small-bowel obstruction. EXAM: DG ABDOMEN ACUTE W/ 1V CHEST  COMPARISON:  06/28/2017. FINDINGS: Chest x-ray reveals bibasilar pulmonary infiltrates and left-sided pleural effusion. Persistent dilated loops of small bowel are again noted consistent small bowel obstruction. There is a paucity of gas in the colon. No free air identified. IMPRESSION: 1. Persistent dilated loops of small bowel consistent with small bowel obstruction. No significant interim improvement. 2. Chest x-ray reveals bibasilar pulmonary infiltrates. Aspiration cannot be excluded. Left pleural effusion is also noted. Electronically Signed   By: Maisie Fushomas  Register   On: 06/29/2017 10:00   Dg Abd Portable 1v  Result Date: 06/29/2017 CLINICAL DATA:  Initial evaluation for NG tube placement. EXAM: PORTABLE ABDOMEN - 1 VIEW COMPARISON:  Prior radiograph from earlier the same day. FINDINGS: Enteric tube in place with tip overlying the proximal stomach, well beyond the GE junction. Side hole overlies the gastric cardia, just beyond the GE junction as well. Persistent dilated loops of gas-filled small bowel throughout the abdomen, consistent with obstruction. IMPRESSION: 1. Tip and side hole of enteric tube overlying the proximal stomach, beyond the GE junction. 2. Ongoing small bowel obstruction. Electronically Signed   By: Rise MuBenjamin  McClintock M.D.   On: 06/29/2017 23:00   Dg Abd Portable 1v  Result Date: 06/29/2017 CLINICAL DATA:  NG tube placement. EXAM: PORTABLE ABDOMEN - 1 VIEW COMPARISON:  06/29/2017. FINDINGS: NG tube noted with tip below left hemidiaphragm. Distended loops of small bowel are noted consistent with small bowel obstruction. Bibasilar atelectasis/infiltrates. Aspiration cannot be excluded. Left-sided pleural effusion. IMPRESSION: 1. Interim placement of NG tube. Tip below left hemidiaphragm. Persistent distended loops of small bowel consistent small bowel obstruction. 2. Bibasilar atelectasis and infiltrates. Aspiration cannot be excluded. Left-sided pleural effusion. Electronically  Signed   By: Maisie Fushomas  Register   On: 06/29/2017 10:52        Scheduled Meds: . atorvastatin  20 mg Oral q1800  . enoxaparin (LOVENOX) injection  40 mg Subcutaneous Q24H  . folic acid  1 mg  Oral Daily  . LORazepam  0-4 mg Intravenous Q12H  . multivitamin with minerals  1 tablet Oral Daily  . simethicone  160 mg Oral QID  . thiamine  100 mg Oral Daily   Continuous Infusions: . 0.9 % NaCl with KCl 40 mEq / L 75 mL/hr (06/30/17 1530)  . famotidine (PEPCID) IV 20 mg (06/29/17 2300)     LOS: 5 days    Time spent: 40 minutes    Ramiro Harvest, MD [facility] Pager [phone]  If 7PM-7AM, please contact night-coverage [email or web address] Password [facility] 06/30/2017, 4:32 PM

## 2017-06-30 NOTE — Progress Notes (Signed)
Subjective/Chief Complaint: Not passing a lot of as, 1 BM yesterday AM   Objective: Vital signs in last 24 hours: Temp:  [98.4 F (36.9 C)-99.8 F (37.7 C)] 98.4 F (36.9 C) (04/20 0822) Pulse Rate:  [127-131] 127 (04/20 0822) Resp:  [18-20] 18 (04/20 0822) BP: (140-155)/(95-101) 141/101 (04/20 0822) SpO2:  [90 %-91 %] 90 % (04/20 0822) Weight:  [75.8 kg (167 lb 3.2 oz)] 75.8 kg (167 lb 3.2 oz) (04/19 2004) Last BM Date: 06/29/17  Intake/Output from previous day: 04/19 0701 - 04/20 0700 In: 2175.8 [P.O.:360; I.V.:1515.8; IV Piggyback:300] Out: 1110 [Urine:700; Emesis/NG output:410] Intake/Output this shift: No intake/output data recorded.  General appearance: cooperative Resp: clear to auscultation bilaterally Cardio: regular rate and rhythm GI: quite distended and quiet, nontender  Lab Results:  Recent Labs    06/29/17 0321 06/30/17 0556  WBC 8.3 9.4  HGB 9.5* 9.5*  HCT 27.7* 27.3*  PLT 214 263   BMET Recent Labs    06/29/17 0321 06/30/17 0556  NA 137 140  K 3.3* 3.6  CL 101 103  CO2 25 25  GLUCOSE 112* 102*  BUN 10 13  CREATININE 0.94 1.09  CALCIUM 7.6* 8.2*   PT/INR No results for input(s): LABPROT, INR in the last 72 hours. ABG No results for input(s): PHART, HCO3 in the last 72 hours.  Invalid input(s): PCO2, PO2  Studies/Results: Dg Abd 2 Views  Result Date: 06/28/2017 CLINICAL DATA:  Abdominal distension, pain and swelling. EXAM: ABDOMEN - 2 VIEW COMPARISON:  None. FINDINGS: Diffusely dilated gas-filled small bowel loops throughout the abdomen, with associated air-fluid levels, consistent with small-bowel obstruction. Only a small amount of questionable colonic gas is seen in the LEFT abdomen, suggesting complete versus high-grade partial small bowel obstruction. No evidence of free intraperitoneal air seen. No evidence of renal or ureteral calculi. Osseous structures are unremarkable. IMPRESSION: Small-bowel obstruction, with significantly  dilated gas-filled loops of small bowel throughout the abdomen, with associated air-fluid levels. This is most compatible with complete versus high-grade partial small bowel obstruction, less likely ileus. These results will be called to the ordering clinician or representative by the Radiologist Assistant, and communication documented in the PACS or zVision Dashboard. Electronically Signed   By: Bary Richard M.D.   On: 06/28/2017 20:40   Dg Abd Acute W/chest  Result Date: 06/29/2017 CLINICAL DATA:  Small-bowel obstruction. EXAM: DG ABDOMEN ACUTE W/ 1V CHEST COMPARISON:  06/28/2017. FINDINGS: Chest x-ray reveals bibasilar pulmonary infiltrates and left-sided pleural effusion. Persistent dilated loops of small bowel are again noted consistent small bowel obstruction. There is a paucity of gas in the colon. No free air identified. IMPRESSION: 1. Persistent dilated loops of small bowel consistent with small bowel obstruction. No significant interim improvement. 2. Chest x-ray reveals bibasilar pulmonary infiltrates. Aspiration cannot be excluded. Left pleural effusion is also noted. Electronically Signed   By: Maisie Fus  Register   On: 06/29/2017 10:00   Dg Abd Portable 1v  Result Date: 06/29/2017 CLINICAL DATA:  Initial evaluation for NG tube placement. EXAM: PORTABLE ABDOMEN - 1 VIEW COMPARISON:  Prior radiograph from earlier the same day. FINDINGS: Enteric tube in place with tip overlying the proximal stomach, well beyond the GE junction. Side hole overlies the gastric cardia, just beyond the GE junction as well. Persistent dilated loops of gas-filled small bowel throughout the abdomen, consistent with obstruction. IMPRESSION: 1. Tip and side hole of enteric tube overlying the proximal stomach, beyond the GE junction. 2. Ongoing small bowel obstruction. Electronically  Signed   By: Rise MuBenjamin  McClintock M.D.   On: 06/29/2017 23:00   Dg Abd Portable 1v  Result Date: 06/29/2017 CLINICAL DATA:  NG tube  placement. EXAM: PORTABLE ABDOMEN - 1 VIEW COMPARISON:  06/29/2017. FINDINGS: NG tube noted with tip below left hemidiaphragm. Distended loops of small bowel are noted consistent with small bowel obstruction. Bibasilar atelectasis/infiltrates. Aspiration cannot be excluded. Left-sided pleural effusion. IMPRESSION: 1. Interim placement of NG tube. Tip below left hemidiaphragm. Persistent distended loops of small bowel consistent small bowel obstruction. 2. Bibasilar atelectasis and infiltrates. Aspiration cannot be excluded. Left-sided pleural effusion. Electronically Signed   By: Maisie Fushomas  Register   On: 06/29/2017 10:52    Anti-infectives: Anti-infectives (From admission, onward)   None      Assessment/Plan: Pancreatitis Ileus due to above - continue NGT, we will follow  LOS: 5 days    Liz MaladyBurke E Brasen Bundren 06/30/2017

## 2017-07-01 ENCOUNTER — Inpatient Hospital Stay (HOSPITAL_COMMUNITY): Payer: Self-pay

## 2017-07-01 DIAGNOSIS — J189 Pneumonia, unspecified organism: Secondary | ICD-10-CM | POA: Diagnosis not present

## 2017-07-01 DIAGNOSIS — Y95 Nosocomial condition: Secondary | ICD-10-CM

## 2017-07-01 LAB — COMPREHENSIVE METABOLIC PANEL
ALBUMIN: 2.1 g/dL — AB (ref 3.5–5.0)
ALT: 59 U/L (ref 17–63)
AST: 153 U/L — AB (ref 15–41)
Alkaline Phosphatase: 89 U/L (ref 38–126)
Anion gap: 12 (ref 5–15)
BUN: 10 mg/dL (ref 6–20)
CHLORIDE: 110 mmol/L (ref 101–111)
CO2: 21 mmol/L — AB (ref 22–32)
CREATININE: 1.12 mg/dL (ref 0.61–1.24)
Calcium: 7.9 mg/dL — ABNORMAL LOW (ref 8.9–10.3)
GFR calc Af Amer: >60 mL/min (ref >60)
GLUCOSE: 82 mg/dL (ref 65–99)
POTASSIUM: 3.9 mmol/L (ref 3.5–5.1)
SODIUM: 143 mmol/L (ref 135–145)
Total Bilirubin: 3.7 mg/dL — ABNORMAL HIGH (ref 0.3–1.2)
Total Protein: 6.1 g/dL — ABNORMAL LOW (ref 6.5–8.1)

## 2017-07-01 LAB — CBC WITH DIFFERENTIAL/PLATELET
BASOS PCT: 0 %
EOS PCT: 0 %
HCT: 23.7 % — ABNORMAL LOW (ref 39.0–52.0)
Hemoglobin: 8.2 g/dL — ABNORMAL LOW (ref 13.0–17.0)
Lymphocytes Relative: 17 %
MCH: 32.2 pg (ref 26.0–34.0)
MCHC: 34.6 g/dL (ref 30.0–36.0)
MCV: 92.9 fL (ref 78.0–100.0)
Monocytes Relative: 10 %
Neutrophils Relative %: 73 %
PLATELETS: 291 10*3/uL (ref 150–400)
RBC: 2.55 MIL/uL — AB (ref 4.22–5.81)
RDW: 13.7 % (ref 11.5–15.5)
WBC MORPHOLOGY: INCREASED
WBC: 14.4 10*3/uL — AB (ref 4.0–10.5)

## 2017-07-01 LAB — STREP PNEUMONIAE URINARY ANTIGEN: STREP PNEUMO URINARY ANTIGEN: NEGATIVE

## 2017-07-01 LAB — LIPASE, BLOOD: LIPASE: 390 U/L — AB (ref 11–51)

## 2017-07-01 LAB — MAGNESIUM: Magnesium: 2.4 mg/dL (ref 1.7–2.4)

## 2017-07-01 MED ORDER — HYDROCODONE-HOMATROPINE 5-1.5 MG/5ML PO SYRP
5.0000 mL | ORAL_SOLUTION | ORAL | Status: DC | PRN
  Administered 2017-07-01 – 2017-07-04 (×2): 5 mL via ORAL
  Filled 2017-07-01 (×2): qty 5

## 2017-07-01 MED ORDER — FUROSEMIDE 10 MG/ML IJ SOLN
40.0000 mg | Freq: Once | INTRAMUSCULAR | Status: AC
  Administered 2017-07-01: 40 mg via INTRAVENOUS
  Filled 2017-07-01: qty 4

## 2017-07-01 MED ORDER — HYDRALAZINE HCL 20 MG/ML IJ SOLN
10.0000 mg | Freq: Once | INTRAMUSCULAR | Status: AC
  Administered 2017-07-01: 10 mg via INTRAVENOUS
  Filled 2017-07-01: qty 1

## 2017-07-01 MED ORDER — PIPERACILLIN-TAZOBACTAM 3.375 G IVPB
3.3750 g | Freq: Three times a day (TID) | INTRAVENOUS | Status: DC
Start: 2017-07-01 — End: 2017-07-03
  Administered 2017-07-01 – 2017-07-03 (×6): 3.375 g via INTRAVENOUS
  Filled 2017-07-01 (×7): qty 50

## 2017-07-01 MED ORDER — LEVALBUTEROL HCL 0.63 MG/3ML IN NEBU
0.6300 mg | INHALATION_SOLUTION | Freq: Four times a day (QID) | RESPIRATORY_TRACT | Status: DC
  Administered 2017-07-02: 0.63 mg via RESPIRATORY_TRACT
  Filled 2017-07-01: qty 3

## 2017-07-01 MED ORDER — DM-GUAIFENESIN ER 30-600 MG PO TB12
2.0000 | ORAL_TABLET | Freq: Two times a day (BID) | ORAL | Status: DC
  Administered 2017-07-01 – 2017-07-05 (×8): 2 via ORAL
  Filled 2017-07-01: qty 2
  Filled 2017-07-01: qty 1
  Filled 2017-07-01 (×4): qty 2
  Filled 2017-07-01: qty 1
  Filled 2017-07-01 (×2): qty 2

## 2017-07-01 MED ORDER — POTASSIUM CHLORIDE 10 MEQ/100ML IV SOLN
10.0000 meq | INTRAVENOUS | Status: AC
  Administered 2017-07-01 (×3): 10 meq via INTRAVENOUS
  Filled 2017-07-01 (×3): qty 100

## 2017-07-01 MED ORDER — ENSURE ENLIVE PO LIQD
237.0000 mL | Freq: Two times a day (BID) | ORAL | Status: DC
  Administered 2017-07-01 (×2): 237 mL via ORAL

## 2017-07-01 MED ORDER — IPRATROPIUM BROMIDE 0.02 % IN SOLN
0.5000 mg | Freq: Four times a day (QID) | RESPIRATORY_TRACT | Status: DC
  Administered 2017-07-02: 0.5 mg via RESPIRATORY_TRACT
  Filled 2017-07-01: qty 2.5

## 2017-07-01 NOTE — Progress Notes (Signed)
PROGRESS NOTE    Ivan Harper  NFA:[id number] DOB: May 08, 1970 DOA: 06/25/2017 PCP: Patient, No Pcp Per    Brief Narrative:  Ivan Harper a 47 y.o.malewithno significant pastmedical historypresenting with abdominal pain starting 1 day prior to admission around 4 PM.  He drank a lot this weekend - all weekend. He thinks he drank about 12 beers over the weekend. +nausea, emesis 4-5 times since the pain started. He has not seen blood in his emesis. No fevers. No sick contacts. He does still have a gallbladder.   ED Course:   Denied h/o pancreatitis, occasional ETOH use.  Negative RUQ [place].  Lipase >1000, AST about 100.  Abdominal CT pending.  Receiving IVF and pain meds.  Patient improved clinically in terms of his pancreatitis however continue to have distended abdomen and abdominal tightness.  Abdominal films obtained consistent with a high-grade small bowel obstruction.  Patient made n.p.o.  NG tube placed.  General surgery consulted.      Assessment & Plan:   Principal Problem:   Ileus, unspecified (HCC) Active Problems:   Acute pancreatitis   Hospital acquired PNA   Hyperglycemia   Alcohol abuse   Penile swelling   SBO (small bowel obstruction) (HCC)   Fever   Hypokalemia   Abdominal distension  #1  Ileus secondary to acute pancreatitis versus small bowel obstruction Patient with ileus likely complication from his acute pancreatitis.  Patient states had his appendix taken out in approximately 2008 but not quite sure of the exact year but was when Obama became president.  Concern for complication of pancreatitis lipase levels trending back up.  LFTs trending up.  Continue IV fluids.  Patient states had bowel movement last night.  NG tube has been removed per general surgery and patient started on clears .  Serial abdominal films.  General surgery ff and appreciate their input and recommendations.   2 acute pancreatitis Likely secondary to alcohol use.   Patient presented abdominal pain nausea and emesis.  Lipase levels elevated at 1230 with a AST of 133 and ALT of 56 on presentation.  CT abdomen and pelvis done with severe pancreatitis of the distal body and pancreatic tail with large amount of fluid around the distal pancreas.  Areas of hypo-enhancement in the distal body of the pancreas concerning for pancreatic necrosis.  CT abdomen and pelvis negative for gallstones or thickened gallbladder wall.  Triglycerides of 233.  Patient with clinical improvement.  LFTs trending back up.  Lipase of 390 from 352 from 161, 127.  Patient now has developed likely ileus versus high-grade small bowel obstruction.  Patient made n.p.o.  NG tube was placed which has subsequently been discontinued per general surgery.  Continue IV fluids.  General surgery following and appreciate input and recommendations.  3.  Alcohol abuse Alcohol cessation was stressed to patient.  Due to abdominal distention abdominal ultrasound was obtained which was negative for ascites however consistent with pancreatitis, fluid filled small bowel, scattered intraperitoneal fluid, no evidence of gallstones.  No evidence of cirrhosis.  Patient with no signs of withdrawal.  Continue ativan withdrawal protocol.  Follow.  4.  Hypokalemia Repleted.  Keep potassium greater than 4.  Magnesium level is 2.3.  We will give IV KCl x3 runs.   5.  Hyperlycemia Likely reactive.  Hemoglobin A1c of 4.8.  Follow.   6 penile swelling Question onset.  Likely secondary to edema.  Patient is circumcised.  Denies any pain.  No discharge.  Patient is  currently afebrile.  Able to urinate without any problems.  No dysuria.  Penile swelling improved.  Follow.    7.  Abdominal distention Secondary to ileus versus small bowel obstruction.  See #1.  8.  Low-grade fever secondary to HCAP versus aspiration pneumonia Patient noted to have a low-grade temperature of 100.8 morning of 06/29/2017.  Blood cultures with no  growth to date.  Patient with a cough now.  Patient also noted to have a leukocytosis with a white count of 14.4.  Check a sputum Gram stain and culture.  Check a urine Legionella antigen.  Check a urine pneumococcus antigen.  Fever curve trending down.  Abdominal films concerning for airspace disease.  Will place empirically on IV Zosyn, Mucinex DM, scheduled duo nebs.  Supportive care.   9.  Sinus tachycardia Likely secondary to problems #1 and 2 probable pneumonia.  Continue IV fluids.  IV Lopressor as needed for heart rate greater than 130.  Patient will be started empirically on IV antibiotics.      DVT prophylaxis: Lovenox Code Status: Full Family Communication: Updated patient.  No family at bedside.  Disposition Plan: Home when clinically improved, resolution of ileus versus small bowel obstruction and per general surgery.    Consultants:   General surgery: Dr. Violeta Gelinas 06/29/2017  Procedures:  CT abdomen and pelvis 06/25/2017  Abdominal ultrasound 06/27/2017  RU Quadrant ultrasound 06/25/2017  Abdominal x-rays 06/28/2017, 06/29/2017, 06/30/2017, 07/01/2017  Antimicrobials:  IV Zosyn 07/01/2017   Subjective: Patient with NG tube has been discontinued per general surgery.  Patient sipping on some clear liquids.  Denies any nausea or emesis.  Patient coughing however nonproductive.  Patient states had a bowel movement yesterday.  No chest pain.  Denies any shortness of breath.   Objective: Vitals:   06/30/17 2034 07/01/17 0512 07/01/17 0758 07/01/17 1723  BP: 137/83 (!) 158/98 (!) 141/97 (!) 168/106  Pulse: (!) 122 (!) 124 (!) 117 (!) 114  Resp: 19 19 18    Temp: 99 F (37.2 C) 98.8 F (37.1 C) 98.6 F (37 C) 99.1 F (37.3 C)  TempSrc: Oral Oral Oral Oral  SpO2: 93% 93% 90% 92%  Weight:      Height:        Intake/Output Summary (Last 24 hours) at 07/01/2017 1731 Last data filed at 07/01/2017 1409 Gross per 24 hour  Intake 2882.92 ml  Output 1900 ml  Net  982.92 ml   Filed Weights   06/25/17 0449 06/29/17 2004  Weight: 77.1 kg (170 lb) 75.8 kg (167 lb 3.2 oz)    Examination:  General exam: Appears calm and comfortable.  NG tube removed. Respiratory system: Some coarse breath sounds in the right base.  Fair air movement.  No crackles.  No wheezing.  Respiratory effort normal. Cardiovascular system: Tachycardia.  No JVD, no murmurs, no rubs.  No lower extremity edema.  Gastrointestinal system: Abdomen is still somewhat distended however less tight.  Still with some diffuse tenderness.  Positive bowel sounds.  No rebound.  No guarding.   Central nervous system: Alert and oriented. No focal neurological deficits. Extremities: Symmetric 5 x 5 power.  Genitourinary: Patient with decreased swelling in the shaft of the penis, nontender, no drainage, circumcised, no warmth, no erythema.  Skin: No rashes, lesions or ulcers Psychiatry: Judgement and insight appear normal. Mood & affect appropriate.     Data Reviewed: I have personally reviewed following labs and imaging studies  CBC: Recent Labs  Lab 06/27/17 0625 06/28/17 1610  06/29/17 0321 06/30/17 0556 07/01/17 0341  WBC 7.6 7.8 8.3 9.4 14.4*  NEUTROABS 5.5  --  4.8 6.4  --   HGB 11.6* 10.0* 9.5* 9.5* 8.2*  HCT 33.4* 29.1* 27.7* 27.3* 23.7*  MCV 96.5 95.4 94.9 93.5 92.9  PLT 185 182 214 263 291   Basic Metabolic Panel: Recent Labs  Lab 06/27/17 0625 06/28/17 0609 06/29/17 0321 06/30/17 0556 07/01/17 0341  NA 136 140 137 140 143  K 3.1* 3.4* 3.3* 3.6 3.9  CL 103 107 101 103 110  CO2 25 26 25 25  21*  GLUCOSE 142* 123* 112* 102* 82  BUN 10 8 10 13 10   CREATININE 1.00 0.97 0.94 1.09 1.12  CALCIUM 6.5* 6.8* 7.6* 8.2* 7.9*  MG 2.0  --  2.3  --  2.4   GFR: Estimated Creatinine Clearance: 80.7 mL/min (by C-G formula based on SCr of 1.12 mg/dL). Liver Function Tests: Recent Labs  Lab 06/26/17 0629 06/28/17 0609 06/29/17 0321 06/30/17 0556 07/01/17 0341  AST 47* 83*  133* 81* 153*  ALT 28 35 50 43 59  ALKPHOS 36* 50 74 74 89  BILITOT 1.5* 1.4* 1.6* 3.8* 3.7*  PROT 5.7* 6.0* 6.1* 6.3* 6.1*  ALBUMIN 2.7* 2.4* 2.4* 2.3* 2.1*   Recent Labs  Lab 06/27/17 0625 06/28/17 0609 06/29/17 0321 06/30/17 0556 07/01/17 0341  LIPASE 144* 127* 161* 352* 390*   No results for input(s): AMMONIA in the last 168 hours. Coagulation Profile: No results for input(s): INR, PROTIME in the last 168 hours. Cardiac Enzymes: No results for input(s): CKTOTAL, CKMB, CKMBINDEX, TROPONINI in the last 168 hours. BNP (last 3 results) No results for input(s): PROBNP in the last 8760 hours. HbA1C: No results for input(s): HGBA1C in the last 72 hours. CBG: Recent Labs  Lab 06/25/17 2058  GLUCAP 128*   Lipid Profile: No results for input(s): CHOL, HDL, LDLCALC, TRIG, CHOLHDL, LDLDIRECT in the last 72 hours. Thyroid Function Tests: Recent Labs    06/29/17 0321  TSH 2.000   Anemia Panel: No results for input(s): VITAMINB12, FOLATE, FERRITIN, TIBC, IRON, RETICCTPCT in the last 72 hours. Sepsis Labs: No results for input(s): PROCALCITON, LATICACIDVEN in the last 168 hours.  Recent Results (from the past 240 hour(s))  Culture, blood (Routine X 2) w Reflex to ID Panel     Status: None (Preliminary result)   Collection Time: 06/29/17  9:01 AM  Result Value Ref Range Status   Specimen Description BLOOD LEFT ANTECUBITAL  Final   Special Requests   Final    BOTTLES DRAWN AEROBIC AND ANAEROBIC Blood Culture adequate volume   Culture   Final    NO GROWTH 2 DAYS Performed at [facility], [address] [place], [place] [address]    Report Status PENDING  Incomplete  Culture, blood (Routine X 2) w Reflex to ID Panel     Status: None (Preliminary result)   Collection Time: 06/29/17  9:06 AM  Result Value Ref Range Status   Specimen Description BLOOD LEFT HAND  Final   Special Requests   Final    BOTTLES DRAWN AEROBIC ONLY Blood Culture adequate volume   Culture    Final    NO GROWTH 2 DAYS Performed at [facility], [address] [place], [place] [address]    Report Status PENDING  Incomplete  Urine Culture     Status: None   Collection Time: 06/29/17 11:28 AM  Result Value Ref Range Status   Specimen  Description URINE, RANDOM  Final   Special Requests NONE  Final   Culture   Final    NO GROWTH Performed at [facility], [address] [place], [place] [address]    Report Status 06/30/2017 FINAL  Final         Radiology Studies: Dg Abd 2 Views  Result Date: 07/01/2017 CLINICAL DATA:  Small bowel obstruction. EXAM: ABDOMEN - 2 VIEW COMPARISON:  Plain films of the abdomen 06/29/2017 and 06/30/2017. CT abdomen and pelvis 06/25/2017. FINDINGS: NG tube remains in place. Side port is just within the stomach. The tube could be advanced 2-3 cm. Gaseous distention of small bowel with loops measuring up to 5.5 cm and air-fluid levels persist. There is little to no gas in the colon. Left pleural effusion and basilar airspace disease are noted. IMPRESSION: No change in small bowel obstruction.  Airspace disease Left pleural effusion and basilar. Electronically Signed   By: Drusilla Kanner M.D.   On: 07/01/2017 11:00   Dg Abd 2 Views  Result Date: 06/30/2017 CLINICAL DATA:  Encounter for small bowel obstruction EXAM: ABDOMEN - 2 VIEW COMPARISON:  the previous day's study FINDINGS: Consolidation/atelectasis in the visualized left lower lung with probable effusion. No free air. Nasogastric tube extends into the decompressed stomach. Multiple dilated small bowel loops in the mid abdomen with fluid levels on the erect radiograph. The colon is decompressed. Regional bones unremarkable. IMPRESSION: 1. Persistent small bowel obstruction, nasogastric tube in the stomach. 2. No free air. Electronically Signed   By: Corlis Leak M.D.   On: 06/30/2017 09:48   Dg Abd Portable 1v  Result Date: 06/29/2017 CLINICAL DATA:  Initial evaluation for NG tube  placement. EXAM: PORTABLE ABDOMEN - 1 VIEW COMPARISON:  Prior radiograph from earlier the same day. FINDINGS: Enteric tube in place with tip overlying the proximal stomach, well beyond the GE junction. Side hole overlies the gastric cardia, just beyond the GE junction as well. Persistent dilated loops of gas-filled small bowel throughout the abdomen, consistent with obstruction. IMPRESSION: 1. Tip and side hole of enteric tube overlying the proximal stomach, beyond the GE junction. 2. Ongoing small bowel obstruction. Electronically Signed   By: Rise Mu M.D.   On: 06/29/2017 23:00        Scheduled Meds: . atorvastatin  20 mg Oral q1800  . dextromethorphan-guaiFENesin  2 tablet Oral BID  . enoxaparin (LOVENOX) injection  40 mg Subcutaneous Q24H  . feeding supplement (ENSURE ENLIVE)  237 mL Oral BID BM  . folic acid  1 mg Oral Daily  . multivitamin with minerals  1 tablet Oral Daily  . simethicone  160 mg Oral QID  . thiamine  100 mg Oral Daily   Continuous Infusions: . 0.9 % NaCl with KCl 40 mEq / L 125 mL/hr (07/01/17 1506)  . famotidine (PEPCID) IV Stopped (07/01/17 1101)  . piperacillin-tazobactam (ZOSYN)  IV 3.375 g (07/01/17 1458)     LOS: 6 days    Time spent: 40 minutes    Ramiro Harvest, MD [facility] Pager [phone]  If 7PM-7AM, please contact night-coverage [email or web address] Password [facility] 07/01/2017, 5:31 PM

## 2017-07-01 NOTE — Progress Notes (Signed)
Pharmacy Antibiotic Note  Ivan CourserWilliam R Harper is a 47 y.o. male admitted on 06/25/2017 with acute pancreatitis post-excessive alcohol ingestion. Patient has a high-grade small bowel obstruction and a NG tube has been placed.  Pharmacy has been consulted for zosyn dosing as there is a concern for aspiration pneumonia.  WBC 14.4, afebrile, CrCl ~3580ml/min  Plan: Zosyn 3.375g IV Q8hour Follow-up on clinical progression and LOT Monitor renal function  Height: 5\' 9"  (175.3 cm) Weight: 167 lb 3.2 oz (75.8 kg) IBW/kg (Calculated) : 70.7  Temp (24hrs), Avg:98.6 F (37 C), Min:97.9 F (36.6 C), Max:99 F (37.2 C)  Recent Labs  Lab 06/27/17 0625 06/28/17 0609 06/29/17 0321 06/30/17 0556 07/01/17 0341  WBC 7.6 7.8 8.3 9.4 14.4*  CREATININE 1.00 0.97 0.94 1.09 1.12    Estimated Creatinine Clearance: 80.7 mL/min (by C-G formula based on SCr of 1.12 mg/dL).    No Known Allergies  Microbiology results: 4/19 BCx: ngtd 4/19 UCx: ngtd   Ivan Harper, PharmD Pharmacy Resident Pager: [phone]

## 2017-07-01 NOTE — Evaluation (Signed)
Clinical/Bedside Swallow Evaluation Patient Details  Name: Ivan Harper MRN: [id number] Date of Birth: [birth year]  Today's Date: 07/01/2017 Time: SLP Start Time (ACUTE ONLY): 1658 SLP Stop Time (ACUTE ONLY): 1713 SLP Time Calculation (min) (ACUTE ONLY): 15 min  Past Medical History: History reviewed. No pertinent past medical history. Past Surgical History:  Past Surgical History:  Procedure Laterality Date  . APPENDECTOMY     HPI:  Ivan Harper is a 47 y.o. male admitted on 06/25/2017 with ileus, acute pancreatitis post-excessive alcohol ingestion. Patient had a high-grade small bowel obstruction and NG tube was placed, removed this morning. Cleared by general surgery for clear liquids, Ensure. CXR 06/29/17 revealed bibasilar pulmonary infiltrates; concern for aspiration PNA.   Assessment / Plan / Recommendation Clinical Impression  Patient presents with oropharyngeal swallow which appears at bedside to be within functional limits with adequate airway protection. No overt signs of aspiration observed despite challenging with consecutive straw sips of thin liquids in excess of 3oz; swallow appears strong and timely. Pt denies coughing or choking with meals. Assessed with thin water, Ensure. Pt consumed approximately 8 oz of liquid without overt signs of aspiration, though he belches after consuming larger quantities. He reports globus sensation at sternum and has delayed coughing stating, "I feel like there's air in there and I'm trying to belch." Complaints suggest esophageal vs pharyngeal dysphagia; consider GI consult. Recommend diet advancement as tolerated per MD. No further skilled ST needs identified; will s/o.     SLP Visit Diagnosis: Dysphagia, unspecified (R13.10)    Aspiration Risk  Mild aspiration risk    Diet Recommendation Thin liquid(advance as tolerated per MD)   Liquid Administration via: Cup;Straw Medication Administration: Whole meds with liquid Supervision:  Patient able to self feed Compensations: Slow rate;Small sips/bites Postural Changes: Seated upright at 90 degrees;Remain upright for at least 30 minutes after po intake    Other  Recommendations Recommended Consults: Consider GI evaluation Oral Care Recommendations: Oral care BID   Follow up Recommendations None      Frequency and Duration            Prognosis Prognosis for Safe Diet Advancement: Good      Swallow Study   General Date of Onset: 06/25/17 HPI: Ivan Harper is a 47 y.o. male admitted on 06/25/2017 with ileus, acute pancreatitis post-excessive alcohol ingestion. Patient had a high-grade small bowel obstruction and NG tube was placed, removed this morning. Cleared by general surgery for clear liquids, Ensure. CXR 06/29/17 revealed bibasilar pulmonary infiltrates; concern for aspiration PNA. Type of Study: Bedside Swallow Evaluation Previous Swallow Assessment: none in chart Diet Prior to this Study: Thin liquids Temperature Spikes Noted: No Respiratory Status: Room air History of Recent Intubation: No Behavior/Cognition: Alert;Cooperative;Pleasant mood Oral Cavity Assessment: Within Functional Limits Oral Care Completed by SLP: No Oral Cavity - Dentition: Adequate natural dentition Vision: Functional for self-feeding Self-Feeding Abilities: Able to feed self Patient Positioning: Upright in bed Baseline Vocal Quality: Normal Volitional Cough: Strong Volitional Swallow: Able to elicit    Oral/Motor/Sensory Function Overall Oral Motor/Sensory Function: Within functional limits   Ice Chips Ice chips: Not tested   Thin Liquid Thin Liquid: Within functional limits Presentation: Straw;Cup;Self Fed    Nectar Thick Nectar Thick Liquid: Not tested   Honey Thick Honey Thick Liquid: Not tested   Puree Puree: Not tested   Solid   GO   Solid: Not tested       [name], MS, CCC-SLP Speech-Language Pathologist [phone]  [name] 07/01/2017,5:26  PM

## 2017-07-01 NOTE — Progress Notes (Signed)
  Progress Note: General Surgery Service   Assessment/Plan: Patient Active Problem List   Diagnosis Date Noted  . Ileus, unspecified (HCC) 06/30/2017  . Abdominal distension   . SBO (small bowel obstruction) (HCC) 06/29/2017  . Fever   . Hypokalemia   . Penile swelling 06/28/2017  . Acute pancreatitis 06/25/2017  . Hyperglycemia 06/25/2017  . Alcohol abuse 06/25/2017   Ileus after pancreatitis, output clear now, no pain, some BM yesterday -remove NG tube -clear liquids and ensure   LOS: 6 days  Chief Complaint/Subjective: No pain, no nausea, +Bm overnight  Objective: Vital signs in last 24 hours: Temp:  [97.9 F (36.6 C)-99 F (37.2 C)] 98.6 F (37 C) (04/21 0758) Pulse Rate:  [117-136] 117 (04/21 0758) Resp:  [18-20] 18 (04/21 0758) BP: (137-158)/(83-98) 141/97 (04/21 0758) SpO2:  [90 %-93 %] 90 % (04/21 0758) Last BM Date: 06/30/17  Intake/Output from previous day: 04/20 0701 - 04/21 0700 In: 3232.9 [I.V.:1732.9; IV Piggyback:1500] Out: 2200 [Urine:1350; Emesis/NG output:850] Intake/Output this shift: No intake/output data recorded.  Lungs: CTAB  Cardiovascular: rrr  Abd: soft, distended, nontender  Extremities: no edema  Neuro: AOx4  Lab Results: CBC  Recent Labs    06/30/17 0556 07/01/17 0341  WBC 9.4 14.4*  HGB 9.5* 8.2*  HCT 27.3* 23.7*  PLT 263 291   BMET Recent Labs    06/30/17 0556 07/01/17 0341  NA 140 143  K 3.6 3.9  CL 103 110  CO2 25 21*  GLUCOSE 102* 82  BUN 13 10  CREATININE 1.09 1.12  CALCIUM 8.2* 7.9*   PT/INR No results for input(s): LABPROT, INR in the last 72 hours. ABG No results for input(s): PHART, HCO3 in the last 72 hours.  Invalid input(s): PCO2, PO2  Studies/Results:  Anti-infectives: Anti-infectives (From admission, onward)   None      Medications: Scheduled Meds: . atorvastatin  20 mg Oral q1800  . enoxaparin (LOVENOX) injection  40 mg Subcutaneous Q24H  . folic acid  1 mg Oral Daily  .  LORazepam  0-4 mg Intravenous Q12H  . multivitamin with minerals  1 tablet Oral Daily  . simethicone  160 mg Oral QID  . thiamine  100 mg Oral Daily   Continuous Infusions: . 0.9 % NaCl with KCl 40 mEq / L 125 mL/hr (07/01/17 0629)  . famotidine (PEPCID) IV Stopped (06/30/17 2347)   PRN Meds:.acetaminophen **OR** acetaminophen, ketorolac, metoprolol tartrate, morphine injection, ondansetron (ZOFRAN) IV, oxyCODONE  Rodman PickleLuke Aaron Kinsinger, MD Pg# [phone] [facility]

## 2017-07-02 ENCOUNTER — Inpatient Hospital Stay (HOSPITAL_COMMUNITY): Payer: Self-pay

## 2017-07-02 ENCOUNTER — Encounter (HOSPITAL_COMMUNITY): Payer: Self-pay | Admitting: Radiology

## 2017-07-02 LAB — CBC WITH DIFFERENTIAL/PLATELET
BAND NEUTROPHILS: 11 %
BASOS ABS: 0 10*3/uL (ref 0.0–0.1)
Basophils Relative: 0 %
Blasts: 0 %
EOS ABS: 0.1 10*3/uL (ref 0.0–0.7)
EOS PCT: 1 %
HCT: 22.8 % — ABNORMAL LOW (ref 39.0–52.0)
Hemoglobin: 8.2 g/dL — ABNORMAL LOW (ref 13.0–17.0)
LYMPHS ABS: 1 10*3/uL (ref 0.7–4.0)
Lymphocytes Relative: 7 %
MCH: 32.8 pg (ref 26.0–34.0)
MCHC: 36 g/dL (ref 30.0–36.0)
MCV: 91.2 fL (ref 78.0–100.0)
MONOS PCT: 20 %
MYELOCYTES: 0 %
Metamyelocytes Relative: 0 %
Monocytes Absolute: 2.8 10*3/uL — ABNORMAL HIGH (ref 0.1–1.0)
NEUTROS ABS: 10 10*3/uL — AB (ref 1.7–7.7)
Neutrophils Relative %: 61 %
Other: 0 %
PLATELETS: 376 10*3/uL (ref 150–400)
Promyelocytes Relative: 0 %
RBC: 2.5 MIL/uL — AB (ref 4.22–5.81)
RDW: 14.3 % (ref 11.5–15.5)
WBC: 13.9 10*3/uL — AB (ref 4.0–10.5)
nRBC: 0 /100 WBC

## 2017-07-02 LAB — LEGIONELLA PNEUMOPHILA SEROGP 1 UR AG: L. PNEUMOPHILA SEROGP 1 UR AG: NEGATIVE

## 2017-07-02 LAB — BASIC METABOLIC PANEL
Anion gap: 10 (ref 5–15)
BUN: 11 mg/dL (ref 6–20)
CO2: 22 mmol/L (ref 22–32)
CREATININE: 0.99 mg/dL (ref 0.61–1.24)
Calcium: 8.2 mg/dL — ABNORMAL LOW (ref 8.9–10.3)
Chloride: 111 mmol/L (ref 101–111)
GFR calc Af Amer: >60 mL/min (ref >60)
GLUCOSE: 87 mg/dL (ref 65–99)
POTASSIUM: 3.7 mmol/L (ref 3.5–5.1)
SODIUM: 143 mmol/L (ref 135–145)

## 2017-07-02 LAB — MAGNESIUM: MAGNESIUM: 2.4 mg/dL (ref 1.7–2.4)

## 2017-07-02 MED ORDER — IOPAMIDOL (ISOVUE-300) INJECTION 61%: 100.0000 mL | Freq: Once | INTRAVENOUS | Status: DC

## 2017-07-02 MED ORDER — IOPAMIDOL (ISOVUE-300) INJECTION 61%
INTRAVENOUS | Status: AC
  Filled 2017-07-02: qty 100

## 2017-07-02 MED ORDER — BOOST / RESOURCE BREEZE PO LIQD CUSTOM
1.0000 | Freq: Three times a day (TID) | ORAL | Status: DC
  Administered 2017-07-02 – 2017-07-03 (×3): 1 via ORAL
  Administered 2017-07-04: 1 mL via ORAL
  Administered 2017-07-04 – 2017-07-05 (×3): 1 via ORAL
  Filled 2017-07-02 (×13): qty 1

## 2017-07-02 MED ORDER — LEVALBUTEROL HCL 0.63 MG/3ML IN NEBU: 0.6300 mg | INHALATION_SOLUTION | Freq: Four times a day (QID) | RESPIRATORY_TRACT | Status: DC | PRN

## 2017-07-02 MED ORDER — IOPAMIDOL (ISOVUE-300) INJECTION 61%
100.0000 mL | Freq: Once | INTRAVENOUS | Status: AC | PRN
  Administered 2017-07-02: 100 mL via INTRAVENOUS

## 2017-07-02 MED ORDER — IOPAMIDOL (ISOVUE-300) INJECTION 61%
INTRAVENOUS | Status: AC
  Filled 2017-07-02: qty 30

## 2017-07-02 MED ORDER — IPRATROPIUM BROMIDE 0.02 % IN SOLN: 0.5000 mg | Freq: Four times a day (QID) | RESPIRATORY_TRACT | Status: DC | PRN

## 2017-07-02 MED ORDER — FUROSEMIDE 10 MG/ML IJ SOLN
60.0000 mg | Freq: Once | INTRAMUSCULAR | Status: AC
  Administered 2017-07-02: 60 mg via INTRAVENOUS
  Filled 2017-07-02: qty 6

## 2017-07-02 MED ORDER — FAMOTIDINE 20 MG PO TABS
20.0000 mg | ORAL_TABLET | Freq: Two times a day (BID) | ORAL | Status: DC
  Administered 2017-07-02 – 2017-07-05 (×6): 20 mg via ORAL
  Filled 2017-07-02 (×6): qty 1

## 2017-07-02 MED ORDER — PRO-STAT SUGAR FREE PO LIQD
30.0000 mL | Freq: Two times a day (BID) | ORAL | Status: DC
  Administered 2017-07-02 – 2017-07-04 (×4): 30 mL via ORAL
  Filled 2017-07-02 (×5): qty 30

## 2017-07-02 NOTE — Progress Notes (Signed)
Nutrition Follow-up  INTERVENTION:   - D/C Ensure Enlive as pt is refusing  - Boost Breeze po TID, each supplement provides 250 kcal and 9 grams of protein  - 30 mL Pro-Stat BID, each provides 100 kcal and 15 grams protein  NUTRITION DIAGNOSIS:   Inadequate oral intake related to acute illness as evidenced by NPO status.  Progressing as pt is now on clear liquid diet  GOAL:   Patient will meet greater than or equal to 90% of their needs  Progressing but unmet at this time  MONITOR:   Diet advancement, PO intake, Labs, Weight trends  REASON FOR ASSESSMENT:   Malnutrition Screening Tool    ASSESSMENT:   Pt admitted with acute EtOH pancreatitis. Pt with no PMHx.  Pt NPO and had NGT placed on 06/29/17 for low intermittent suction due to ileus. NGT removed 07/01/17 and pt put on clear liquids.  Spoke with pt at bedside who reports he is "so tired of drinking." Pt concerned about bloating in abdomen and states that drinking makes him more uncomfortable. Pt denies nausea/vomiting at this time.  Surgery MD ordered Ensure Enlive. Pt states he does not like Ensure Enlive oral nutrition supplements as they are "too sweet." Pt also does not like jello. Pt states he is willing to try Boost Breeze oral nutrition supplements.  I/O's: +4.7 L since admission Weight appears stable since admission.  Medications reviewed and include: 20 mg Pepcid BID, 1 mg folic acid daily, MVI with minerals daily, 100 mg thiamine daily, KCl 40 mEq/L in NaCl @ 50 ml/hr  Labs reviewed: hemoglobin 8.2 (L), HCT 22.8 (L), lipase 390 (H), AST 153 (H)  Diet Order:  Diet clear liquid Room service appropriate? Yes; Fluid consistency: Thin  EDUCATION NEEDS:   No education needs have been identified at this time  Skin:  Skin Assessment: Reviewed RN Assessment  Last BM:  07/01/17 medium type 7  Height:   Ht Readings from Last 1 Encounters:  06/25/17 5\' 9"  (1.753 m)    Weight:   Wt Readings from Last 1  Encounters:  07/01/17 166 lb 14.2 oz (75.7 kg)    Ideal Body Weight:  72.7 kg  BMI:  Body mass index is 24.65 kg/m.  Estimated Nutritional Needs:   Kcal:  2100-2300 kcals  Protein:  105-115 g   Fluid:  >/= 2 L    Earma ReadingKate Jablonski Karrine Kluttz, MS, RD, LDN Pager: [phone] Weekend/After Hours: [phone]

## 2017-07-02 NOTE — Progress Notes (Signed)
  Progress Note: General Surgery Service   Assessment/Plan: Patient Active Problem List   Diagnosis Date Noted  . Hospital acquired PNA 07/01/2017  . Ileus, unspecified (HCC) 06/30/2017  . Abdominal distension   . SBO (small bowel obstruction) (HCC) 06/29/2017  . Fever   . Hypokalemia   . Penile swelling 06/28/2017  . Acute pancreatitis 06/25/2017  . Hyperglycemia 06/25/2017  . Alcohol abuse 06/25/2017   Ileus after pancreatitis, clinically improving- NG out, tolerating clears, having bowel movements Continue clears and ensure. Going for Ct today.    LOS: 7 days  Chief Complaint/Subjective: Got distended after first clears last night but subsequently has had several large bowel movements with improvement. No nausea or emesis.    Objective: Vital signs in last 24 hours: Temp:  [99.1 F (37.3 C)-100.7 F (38.2 C)] 100.7 F (38.2 C) (04/22 0446) Pulse Rate:  [114-117] 115 (04/22 0446) Resp:  [18] 18 (04/22 0446) BP: (151-168)/(94-106) 151/94 (04/22 0446) SpO2:  [91 %-94 %] 91 % (04/22 0446) Weight:  [75.7 kg (166 lb 14.2 oz)-75.8 kg (167 lb 3.2 oz)] 75.7 kg (166 lb 14.2 oz) (04/21 2050) Last BM Date: 07/01/17  Intake/Output from previous day: 04/21 0701 - 04/22 0700 In: 2381.3 [P.O.:300; I.V.:1881.3; IV Piggyback:200] Out: 3675 [Urine:3675] Intake/Output this shift: No intake/output data recorded.  Lungs: CTAB  Cardiovascular: rrr  Abd: soft, distended, nontender  Extremities: no edema  Neuro: AOx4  Lab Results: CBC  Recent Labs    07/01/17 0341 07/02/17 0436  WBC 14.4* 13.9*  HGB 8.2* 8.2*  HCT 23.7* 22.8*  PLT 291 376   BMET Recent Labs    07/01/17 0341 07/02/17 0436  NA 143 143  K 3.9 3.7  CL 110 111  CO2 21* 22  GLUCOSE 82 87  BUN 10 11  CREATININE 1.12 0.99  CALCIUM 7.9* 8.2*   PT/INR No results for input(s): LABPROT, INR in the last 72 hours. ABG No results for input(s): PHART, HCO3 in the last 72 hours.  Invalid input(s): PCO2,  PO2  Studies/Results:  Anti-infectives: Anti-infectives (From admission, onward)   Start     Dose/Rate Route Frequency Ordered Stop   07/01/17 1300  piperacillin-tazobactam (ZOSYN) IVPB 3.375 g     3.375 g 12.5 mL/hr over 240 Minutes Intravenous Every 8 hours 07/01/17 1209        Medications: Scheduled Meds: . atorvastatin  20 mg Oral q1800  . dextromethorphan-guaiFENesin  2 tablet Oral BID  . enoxaparin (LOVENOX) injection  40 mg Subcutaneous Q24H  . feeding supplement (ENSURE ENLIVE)  237 mL Oral BID BM  . folic acid  1 mg Oral Daily  . multivitamin with minerals  1 tablet Oral Daily  . simethicone  160 mg Oral QID  . thiamine  100 mg Oral Daily   Continuous Infusions: . 0.9 % NaCl with KCl 40 mEq / L 50 mL/hr (07/02/17 0530)  . famotidine (PEPCID) IV Stopped (07/01/17 2224)  . piperacillin-tazobactam (ZOSYN)  IV 3.375 g (07/02/17 0540)   PRN Meds:.acetaminophen **OR** acetaminophen, HYDROcodone-homatropine, ipratropium, ketorolac, levalbuterol, metoprolol tartrate, morphine injection, ondansetron (ZOFRAN) IV, oxyCODONE  Berna Buehelsea A Dejour Vos, MD [facility]

## 2017-07-02 NOTE — Progress Notes (Signed)
PROGRESS NOTE    Ivan Harper  ZOX:[id number] DOB: May 30, 1970 DOA: 06/25/2017 PCP: Patient, No Pcp Per    Brief Narrative:  Ivan Harper a 47 y.o.malewithno significant pastmedical historypresenting with abdominal pain starting 1 day prior to admission around 4 PM.  He drank a lot this weekend - all weekend. He thinks he drank about 12 beers over the weekend. +nausea, emesis 4-5 times since the pain started. He has not seen blood in his emesis. No fevers. No sick contacts. He does still have a gallbladder.   ED Course:   Denied h/o pancreatitis, occasional ETOH use.  Negative RUQ [place].  Lipase >1000, AST about 100.  Abdominal CT pending.  Receiving IVF and pain meds.  Patient improved clinically in terms of his pancreatitis however continue to have distended abdomen and abdominal tightness.  Abdominal films obtained consistent with a high-grade small bowel obstruction.  Patient made n.p.o.  NG tube placed.  General surgery consulted.      Assessment & Plan:   Principal Problem:   Ileus, unspecified (HCC) Active Problems:   Acute pancreatitis   Hospital acquired PNA   Hyperglycemia   Alcohol abuse   Penile swelling   SBO (small bowel obstruction) (HCC)   Fever   Hypokalemia   Abdominal distension  #1  Ileus secondary to acute pancreatitis versus small bowel obstruction Patient with ileus likely complication from his acute pancreatitis.  Patient states had his appendix taken out in approximately 2008 but not quite sure of the exact year but was when Obama became president.  Concern for complication of pancreatitis lipase levels trending back up.  LFTs trending up.  Patient with low-grade fevers.  And a leukocytosis.  Decrease IV fluids to 50 cc/h.  We will give a dose of Lasix 60 mg IV x1 as patient feels bloated.  Patient states having bowel movements yesterday.  Tolerating clears.  Abdomen is softer.  No significant change with abdominal films.  Patient on  clears.  General surgery following and appreciate input and recommendations.   2 acute pancreatitis Likely secondary to alcohol use.  Patient presented abdominal pain nausea and emesis.  Lipase levels elevated at 1230 with a AST of 133 and ALT of 56 on presentation.  CT abdomen and pelvis done with severe pancreatitis of the distal body and pancreatic tail with large amount of fluid around the distal pancreas.  Areas of hypo-enhancement in the distal body of the pancreas concerning for pancreatic necrosis.  CT abdomen and pelvis negative for gallstones or thickened gallbladder wall.  Triglycerides of 233.  Patient with clinical improvement.  LFTs trending back up.  Lipase of 390 from 352 from 161, 127.  Patient now has developed likely ileus versus high-grade small bowel obstruction.  Patient improving slowly however still with low-grade fevers, leukocytosis which may be secondary to airspace disease however due to worsening acute pancreatitis with resultant ileus we will repeat CT abdomen and pelvis.  Patient currently tolerating clear liquids.  Decrease IV fluids to 50 cc/h.  General surgery following and appreciate input and recommendations.   3.  Alcohol abuse Alcohol cessation was stressed to patient.  Due to abdominal distention abdominal ultrasound was obtained which was negative for ascites however consistent with pancreatitis, fluid filled small bowel, scattered intraperitoneal fluid, no evidence of gallstones.  No evidence of cirrhosis.  Patient with no signs of withdrawal.  Continue ativan withdrawal protocol.  Follow.  4.  Hypokalemia Repleted.  Keep potassium greater than 4.  Magnesium  level is 2.3.  We will give IV KCl x2 runs.   5.  Hyperlycemia Likely reactive.  Hemoglobin A1c of 4.8.  Follow.   6 penile swelling Question onset.  Likely secondary to edema.  Patient is circumcised.  Denies any pain.  No discharge.  Patient is currently afebrile.  Able to urinate without any problems.   No dysuria.  Swelling improved. Follow.    7.  Abdominal distention Secondary to ileus versus small bowel obstruction.  See #1.  Give a dose of IV Lasix 60 mg x1.  8.  Low-grade fever secondary to HCAP versus aspiration pneumonia Patient noted to have a low-grade temperature of 100.8 morning of 06/29/2017.  Low-grade fever also noted of 100.7 the morning of 07/02/2017.  Blood cultures with no growth to date.  Patient with a cough.  No films reviewed and concerning for left basilar infiltrate and moderate effusion.  Patient also noted to have a leukocytosis with a white count of 13.9 from 14.4.  Sputum Gram stain and cultures pending.  Urine Legionella antigen negative.  Urine pneumococcus antigen negative.  Patient still with some intermittent fevers.  Repeat abdominal films concerning for airspace disease.  Continue empiric IV Zosyn, Mucinex, scheduled nebs.  Will repeat CT abdomen and pelvis to rule out any abscess formation or intra-abdominal pathology.   9.  Sinus tachycardia Likely secondary to problems #1 and 2 probable pneumonia.  Saline lock IV fluids.  Repeat CT abdomen and pelvis pending.   IV Lopressor as needed for heart rate greater than 130.  Continue empiric IV Zosyn.      DVT prophylaxis: Lovenox Code Status: Full Family Communication: Updated patient and wife at bedside. Disposition Plan: Home when clinically improved, resolution of ileus versus small bowel obstruction and per general surgery.    Consultants:   General surgery: Dr. Violeta Gelinas 06/29/2017  Procedures:  CT abdomen and pelvis 06/25/2017  Abdominal ultrasound 06/27/2017  RU Quadrant ultrasound 06/25/2017  Abdominal x-rays 06/28/2017, 06/29/2017, 06/30/2017, 07/01/2017  CT abdomen and pelvis pending 07/02/2017  Antimicrobials:  IV Zosyn 07/01/2017   Subjective: Patient without NG tube.  Drinking oral contrast in preparation for CT abdomen and pelvis.  Patient noted to have low-grade fever of 100.7 earlier  this morning.  States having loose bowel movements.  No chest pain.  Denies any shortness of breath.  Objective: Vitals:   07/01/17 2050 07/02/17 0143 07/02/17 0446 07/02/17 0930  BP: (!) 160/98  (!) 151/94 (!) 149/93  Pulse: (!) 117  (!) 115 (!) 114  Resp: 18  18 19   Temp: 99.6 F (37.6 C)  (!) 100.7 F (38.2 C) 99.7 F (37.6 C)  TempSrc: Oral  Oral Oral  SpO2: 94% 93% 91% 94%  Weight: 75.7 kg (166 lb 14.2 oz)     Height:        Intake/Output Summary (Last 24 hours) at 07/02/2017 1343 Last data filed at 07/02/2017 0507 Gross per 24 hour  Intake 1810.42 ml  Output 3475 ml  Net -1664.58 ml   Filed Weights   06/29/17 2004 07/01/17 1745 07/01/17 2050  Weight: 75.8 kg (167 lb 3.2 oz) 75.8 kg (167 lb 3.2 oz) 75.7 kg (166 lb 14.2 oz)    Examination:  General exam: Appears calm and comfortable.  NG tube removed. Respiratory system: Decreased breath sounds in the bases left greater than right. Fair air movement.  No crackles.  No wheezing.  Respiratory effort normal. Cardiovascular system: Tachycardia.  No JVD, no murmurs, no rubs.  No lower extremity edema.  Gastrointestinal system: Abdomen is softer, mildly distended, decreasing diffuse tenderness, positive bowel sounds.  No rebound.  No guarding.   Central nervous system: Alert and oriented. No focal neurological deficits. Extremities: Symmetric 5 x 5 power.  Genitourinary: Patient with decreased swelling in the shaft of the penis, nontender, no drainage, circumcised, no warmth, no erythema.  Skin: No rashes, lesions or ulcers Psychiatry: Judgement and insight appear normal. Mood & affect appropriate.     Data Reviewed: I have personally reviewed following labs and imaging studies  CBC: Recent Labs  Lab 06/27/17 0625 06/28/17 0609 06/29/17 0321 06/30/17 0556 07/01/17 0341 07/02/17 0436  WBC 7.6 7.8 8.3 9.4 14.4* 13.9*  NEUTROABS 5.5  --  4.8 6.4  --  10.0*  HGB 11.6* 10.0* 9.5* 9.5* 8.2* 8.2*  HCT 33.4* 29.1* 27.7*  27.3* 23.7* 22.8*  MCV 96.5 95.4 94.9 93.5 92.9 91.2  PLT 185 182 214 263 291 376   Basic Metabolic Panel: Recent Labs  Lab 06/27/17 0625 06/28/17 0609 06/29/17 0321 06/30/17 0556 07/01/17 0341 07/02/17 0436  NA 136 140 137 140 143 143  K 3.1* 3.4* 3.3* 3.6 3.9 3.7  CL 103 107 101 103 110 111  CO2 25 26 25 25  21* 22  GLUCOSE 142* 123* 112* 102* 82 87  BUN 10 8 10 13 10 11   CREATININE 1.00 0.97 0.94 1.09 1.12 0.99  CALCIUM 6.5* 6.8* 7.6* 8.2* 7.9* 8.2*  MG 2.0  --  2.3  --  2.4 2.4   GFR: Estimated Creatinine Clearance: 91.3 mL/min (by C-G formula based on SCr of 0.99 mg/dL). Liver Function Tests: Recent Labs  Lab 06/26/17 0629 06/28/17 0609 06/29/17 0321 06/30/17 0556 07/01/17 0341  AST 47* 83* 133* 81* 153*  ALT 28 35 50 43 59  ALKPHOS 36* 50 74 74 89  BILITOT 1.5* 1.4* 1.6* 3.8* 3.7*  PROT 5.7* 6.0* 6.1* 6.3* 6.1*  ALBUMIN 2.7* 2.4* 2.4* 2.3* 2.1*   Recent Labs  Lab 06/27/17 0625 06/28/17 0609 06/29/17 0321 06/30/17 0556 07/01/17 0341  LIPASE 144* 127* 161* 352* 390*   No results for input(s): AMMONIA in the last 168 hours. Coagulation Profile: No results for input(s): INR, PROTIME in the last 168 hours. Cardiac Enzymes: No results for input(s): CKTOTAL, CKMB, CKMBINDEX, TROPONINI in the last 168 hours. BNP (last 3 results) No results for input(s): PROBNP in the last 8760 hours. HbA1C: No results for input(s): HGBA1C in the last 72 hours. CBG: Recent Labs  Lab 06/25/17 2058  GLUCAP 128*   Lipid Profile: No results for input(s): CHOL, HDL, LDLCALC, TRIG, CHOLHDL, LDLDIRECT in the last 72 hours. Thyroid Function Tests: No results for input(s): TSH, T4TOTAL, FREET4, T3FREE, THYROIDAB in the last 72 hours. Anemia Panel: No results for input(s): VITAMINB12, FOLATE, FERRITIN, TIBC, IRON, RETICCTPCT in the last 72 hours. Sepsis Labs: No results for input(s): PROCALCITON, LATICACIDVEN in the last 168 hours.  Recent Results (from the past 240 hour(s))   Culture, blood (Routine X 2) w Reflex to ID Panel     Status: None (Preliminary result)   Collection Time: 06/29/17  9:01 AM  Result Value Ref Range Status   Specimen Description BLOOD LEFT ANTECUBITAL  Final   Special Requests   Final    BOTTLES DRAWN AEROBIC AND ANAEROBIC Blood Culture adequate volume   Culture   Final    NO GROWTH 3 DAYS Performed at [facility], [address] [place], [place] [id number]  Report Status PENDING  Incomplete  Culture, blood (Routine X 2) w Reflex to ID Panel     Status: None (Preliminary result)   Collection Time: 06/29/17  9:06 AM  Result Value Ref Range Status   Specimen Description BLOOD LEFT HAND  Final   Special Requests   Final    BOTTLES DRAWN AEROBIC ONLY Blood Culture adequate volume   Culture   Final    NO GROWTH 3 DAYS Performed at [facility], [address] [place], [place] [address]    Report Status PENDING  Incomplete  Urine Culture     Status: None   Collection Time: 06/29/17 11:28 AM  Result Value Ref Range Status   Specimen Description URINE, RANDOM  Final   Special Requests NONE  Final   Culture   Final    NO GROWTH Performed at [facility], [address], [place], [place] [address]    Report Status 06/30/2017 FINAL  Final         Radiology Studies: Dg Abd 2 Views  Result Date: 07/02/2017 CLINICAL DATA:  Mid abdominal pain and swelling. Ileus. Pancreatitis. EXAM: ABDOMEN - 2 VIEW COMPARISON:  Radiographs dated 07/01/2017, 06/30/2017 and 06/29/2017 FINDINGS: NG tube has been removed. There is persistent dilatation of small bowel loops. Stomach and colon are not distended. Increased moderate left pleural effusion with consolidation in the left lower lobe. Atelectasis at the right lung base. IMPRESSION: 1. Persistent ileus. 2. Increased moderate left effusion. 3. Persistent consolidation in the left lower lobe. 4. Slight atelectasis at the right lung base, unchanged. Electronically Signed   By:  Francene Boyers M.D.   On: 07/02/2017 09:11   Dg Abd 2 Views  Result Date: 07/01/2017 CLINICAL DATA:  Small bowel obstruction. EXAM: ABDOMEN - 2 VIEW COMPARISON:  Plain films of the abdomen 06/29/2017 and 06/30/2017. CT abdomen and pelvis 06/25/2017. FINDINGS: NG tube remains in place. Side port is just within the stomach. The tube could be advanced 2-3 cm. Gaseous distention of small bowel with loops measuring up to 5.5 cm and air-fluid levels persist. There is little to no gas in the colon. Left pleural effusion and basilar airspace disease are noted. IMPRESSION: No change in small bowel obstruction.  Airspace disease Left pleural effusion and basilar. Electronically Signed   By: Drusilla Kanner M.D.   On: 07/01/2017 11:00        Scheduled Meds: . atorvastatin  20 mg Oral q1800  . dextromethorphan-guaiFENesin  2 tablet Oral BID  . enoxaparin (LOVENOX) injection  40 mg Subcutaneous Q24H  . famotidine  20 mg Oral BID  . feeding supplement (ENSURE ENLIVE)  237 mL Oral BID BM  . folic acid  1 mg Oral Daily  . furosemide  60 mg Intravenous Once  . iopamidol      . multivitamin with minerals  1 tablet Oral Daily  . simethicone  160 mg Oral QID  . thiamine  100 mg Oral Daily   Continuous Infusions: . 0.9 % NaCl with KCl 40 mEq / L 50 mL/hr (07/02/17 0530)  . piperacillin-tazobactam (ZOSYN)  IV Stopped (07/02/17 0914)     LOS: 7 days    Time spent: 40 minutes    Ivan Harvest, MD [facility] Pager [phone]  If 7PM-7AM, please contact night-coverage [email or web address] Password [facility] 07/02/2017, 1:43 PM

## 2017-07-03 DIAGNOSIS — D649 Anemia, unspecified: Secondary | ICD-10-CM

## 2017-07-03 DIAGNOSIS — J90 Pleural effusion, not elsewhere classified: Secondary | ICD-10-CM

## 2017-07-03 LAB — CBC WITH DIFFERENTIAL/PLATELET
BASOS ABS: 0 10*3/uL (ref 0.0–0.1)
BASOS PCT: 0 %
EOS PCT: 2 %
Eosinophils Absolute: 0.3 10*3/uL (ref 0.0–0.7)
HEMATOCRIT: 20.2 % — AB (ref 39.0–52.0)
HEMOGLOBIN: 7.1 g/dL — AB (ref 13.0–17.0)
LYMPHS PCT: 16 %
Lymphs Abs: 2.5 10*3/uL (ref 0.7–4.0)
MCH: 32.1 pg (ref 26.0–34.0)
MCHC: 35.1 g/dL (ref 30.0–36.0)
MCV: 91.4 fL (ref 78.0–100.0)
Monocytes Absolute: 1.7 10*3/uL — ABNORMAL HIGH (ref 0.1–1.0)
Monocytes Relative: 11 %
NEUTROS ABS: 11.2 10*3/uL — AB (ref 1.7–7.7)
Neutrophils Relative %: 71 %
Platelets: 394 10*3/uL (ref 150–400)
RBC: 2.21 MIL/uL — ABNORMAL LOW (ref 4.22–5.81)
RDW: 13.9 % (ref 11.5–15.5)
WBC: 15.7 10*3/uL — ABNORMAL HIGH (ref 4.0–10.5)

## 2017-07-03 LAB — RETICULOCYTES
RBC.: 2.27 MIL/uL — AB (ref 4.22–5.81)
RETIC COUNT ABSOLUTE: 29.5 10*3/uL (ref 19.0–186.0)
Retic Ct Pct: 1.3 % (ref 0.4–3.1)

## 2017-07-03 LAB — COMPREHENSIVE METABOLIC PANEL
ALK PHOS: 89 U/L (ref 38–126)
ALT: 78 U/L — AB (ref 17–63)
AST: 156 U/L — ABNORMAL HIGH (ref 15–41)
Albumin: 2 g/dL — ABNORMAL LOW (ref 3.5–5.0)
Anion gap: 9 (ref 5–15)
BILIRUBIN TOTAL: 1.7 mg/dL — AB (ref 0.3–1.2)
BUN: 10 mg/dL (ref 6–20)
CALCIUM: 8 mg/dL — AB (ref 8.9–10.3)
CO2: 24 mmol/L (ref 22–32)
CREATININE: 1.03 mg/dL (ref 0.61–1.24)
Chloride: 108 mmol/L (ref 101–111)
GFR calc Af Amer: >60 mL/min (ref >60)
Glucose, Bld: 103 mg/dL — ABNORMAL HIGH (ref 65–99)
Potassium: 3.2 mmol/L — ABNORMAL LOW (ref 3.5–5.1)
SODIUM: 141 mmol/L (ref 135–145)
TOTAL PROTEIN: 5.9 g/dL — AB (ref 6.5–8.1)

## 2017-07-03 LAB — FOLATE: Folate: 10.7 ng/mL (ref >5.9)

## 2017-07-03 LAB — IRON AND TIBC
IRON: 20 ug/dL — AB (ref 45–182)
Saturation Ratios: 11 % — ABNORMAL LOW (ref 17.9–39.5)
TIBC: 179 ug/dL — ABNORMAL LOW (ref 250–450)
UIBC: 159 ug/dL

## 2017-07-03 LAB — FERRITIN: Ferritin: 2848 ng/mL — ABNORMAL HIGH (ref 24–336)

## 2017-07-03 LAB — VITAMIN B12: Vitamin B-12: 2098 pg/mL — ABNORMAL HIGH (ref 180–914)

## 2017-07-03 LAB — HEMOGLOBIN AND HEMATOCRIT, BLOOD
HCT: 22.5 % — ABNORMAL LOW (ref 39.0–52.0)
HEMOGLOBIN: 7.9 g/dL — AB (ref 13.0–17.0)

## 2017-07-03 MED ORDER — POTASSIUM CHLORIDE CRYS ER 20 MEQ PO TBCR
40.0000 meq | EXTENDED_RELEASE_TABLET | ORAL | Status: AC
  Administered 2017-07-03 (×2): 40 meq via ORAL
  Filled 2017-07-03 (×2): qty 2

## 2017-07-03 MED ORDER — SODIUM CHLORIDE 0.9 % IV SOLN
3.0000 g | Freq: Four times a day (QID) | INTRAVENOUS | Status: DC
  Administered 2017-07-03 – 2017-07-04 (×5): 3 g via INTRAVENOUS
  Filled 2017-07-03 (×6): qty 3

## 2017-07-03 MED ORDER — FUROSEMIDE 10 MG/ML IJ SOLN
40.0000 mg | Freq: Two times a day (BID) | INTRAMUSCULAR | Status: AC
Start: 2017-07-03 — End: 2017-07-03
  Administered 2017-07-03 (×2): 40 mg via INTRAVENOUS
  Filled 2017-07-03 (×2): qty 4

## 2017-07-03 NOTE — Progress Notes (Signed)
Pharmacy Antibiotic Note  Ivan Harper is a 47 y.o. male admitted on 06/25/2017 with acute pancreatitis post-excessive alcohol ingestion. Patient has a high-grade small bowel obstruction and a NG tube has been placed.  Pharmacy has been consulted to de-escalate therapy from Zosyn to Unasyn for aspiration pneumonia.  WBC 14.4, afebrile, CrCl ~7380ml/min  Plan: Unasyn 3G IV q6 hours Follow-up on clinical progression and LOT Monitor renal function  Height: 5\' 9"  (175.3 cm) Weight: 160 lb 3.2 oz (72.7 kg) IBW/kg (Calculated) : 70.7  Temp (24hrs), Avg:99.3 F (37.4 C), Min:98.8 F (37.1 C), Max:99.9 F (37.7 C)  Recent Labs  Lab 06/29/17 0321 06/30/17 0556 07/01/17 0341 07/02/17 0436 07/03/17 0322  WBC 8.3 9.4 14.4* 13.9* 15.7*  CREATININE 0.94 1.09 1.12 0.99 1.03    Estimated Creatinine Clearance: 87.7 mL/min (by C-G formula based on SCr of 1.03 mg/dL).    No Known Allergies  Microbiology results: 4/19 BCx: ngtd 4/19 UCx: ngtd   Ivan Harper, PharmD Clinical Pharmacist 07/03/2017 9:51 AM

## 2017-07-03 NOTE — Progress Notes (Signed)
PROGRESS NOTE    Ivan Harper  ZOX:[id number] DOB: 04-26-1970 DOA: 06/25/2017 PCP: Patient, No Pcp Per    Brief Narrative:  Tawni Pummel Morganis a 47 y.o.malewithno significant pastmedical historypresenting with abdominal pain starting 1 day prior to admission around 4 PM.  He drank a lot this weekend - all weekend. He thinks he drank about 12 beers over the weekend. +nausea, emesis 4-5 times since the pain started. He has not seen blood in his emesis. No fevers. No sick contacts. He does still have a gallbladder.   ED Course:   Denied h/o pancreatitis, occasional ETOH use.  Negative RUQ [place].  Lipase >1000, AST about 100.  Abdominal CT pending.  Receiving IVF and pain meds.  Patient improved clinically in terms of his pancreatitis however continue to have distended abdomen and abdominal tightness.  Abdominal films obtained consistent with a high-grade small bowel obstruction.  Patient made n.p.o.  NG tube placed.  General surgery consulted.      Assessment & Plan:   Principal Problem:   Ileus, unspecified (HCC) Active Problems:   Acute pancreatitis   Hospital acquired PNA   Hyperglycemia   Alcohol abuse   Penile swelling   SBO (small bowel obstruction) (HCC)   Fever   Hypokalemia   Abdominal distension  #1  Ileus secondary to acute pancreatitis versus small bowel obstruction Patient with ileus likely complication from his acute pancreatitis.  Patient states had his appendix taken out in approximately 2008 but not quite sure of the exact year but was when Obama became president.  Concern for complication of pancreatitis lipase levels trending back up.  LFTs trending back up however seem to be plateauing.  Fever curve trending down.  Patient still with a leukocytosis.  Saline lock IV fluids.  Give Lasix 40 mg IV every 12 hours x2 doses as patient seems slightly volume overloaded with bilateral pleural effusions noted on CT abdomen and pelvis.   Patient states having  bowel movements yesterday.  Diet has been advanced to full liquid diet which he is tolerating.  General surgery following and appreciate input and recommendations.   2 acute pancreatitis Likely secondary to alcohol use.  Patient presented abdominal pain nausea and emesis.  Lipase levels elevated at 1230 with a AST of 133 and ALT of 56 on presentation.  CT abdomen and pelvis done with severe pancreatitis of the distal body and pancreatic tail with large amount of fluid around the distal pancreas.  Areas of hypo-enhancement in the distal body of the pancreas concerning for pancreatic necrosis.  CT abdomen and pelvis negative for gallstones or thickened gallbladder wall.  Triglycerides of 233.  Patient with clinical improvement.  LFTs trending back up.  Lipase of 390 from 352 from 161, 127.  Patient now has developed likely ileus versus high-grade small bowel obstruction.  Patient improving slowly however still with low-grade fevers, leukocytosis which may be secondary to airspace disease however due to worsening acute pancreatitis with resultant ileus CT abdomen and pelvis were repeated that showed pancreatic necrosis and formation of pseudocyst.  Patient improving clinically, tolerated clear liquids and diet has been advanced to full liquid diet which is tolerating.  Saline lock IV fluids.  We will give a couple doses of IV Lasix.  Strict I's and O's.  Daily weights.  Follow. General surgery following and appreciate input and recommendations.   3.  Alcohol abuse Alcohol cessation was stressed to patient.  Due to abdominal distention abdominal ultrasound was obtained which was  negative for ascites however consistent with pancreatitis, fluid filled small bowel, scattered intraperitoneal fluid, no evidence of gallstones.  No evidence of cirrhosis.  Patient with no signs of withdrawal.  Continue ativan withdrawal protocol.  Follow.  4.  Hypokalemia Likely secondary to diuretics which were given.  Replete  potassium.  Monitor closely on IV Lasix.  5.  Hyperlycemia Likely reactive.  Hemoglobin A1c of 4.8.  Follow.   6 penile swelling Question onset.  Likely secondary to edema.  Patient is circumcised.  Denies any pain.  No discharge.  Patient is currently afebrile.  Able to urinate without any problems.  No dysuria.  Swelling improved. Follow.    7.  Abdominal distention Secondary to ileus versus small bowel obstruction.  See #1.  Give 2 doses of IV Lasix 40 mg every 12x2.   8.  Low-grade fever secondary to HCAP versus aspiration pneumonia Patient noted to have a low-grade temperature of 100.8 the morning of 06/29/2017.  Low-grade fever also noted of 100.7 the morning of 07/02/2017.  Blood cultures with no growth to date.  Patient with a cough.  No films reviewed and concerning for left basilar infiltrate and moderate effusion.  Patient also noted to have a leukocytosis with a white count of 15.7 from 13.9 from 14.4.  Sputum Gram stain and cultures pending.  Urine Legionella antigen negative.  Urine pneumococcus antigen negative.  Fever curve trending down. Repeat abdominal films concerning for airspace disease.  Continue Mucinex, scheduled nebs.  Change IV Zosyn to IV Unasyn and likely transition to oral Augmentin tomorrow if continued improvement.  CT abdomen and pelvis showing formation of pseudocyst.  Patient however improving clinically.    9.  Sinus tachycardia Likely secondary to problems #1 and 2 probable pneumonia.  IV fluids have been saline locked.  Repeat  CT abdomen and pelvis with pancreatic tail necrosis, increased multiloculated fluid collections in the left abdomen and pelvis consistent with developing pseudocyst.  Increase small amount of intraperitoneal fluid is also noted.  Dilated small bowel loops with transition point anterior right lower quadrant suspicious for small bowel obstruction.  Increased bilateral pleural effusions and bibasilar atelectasis.  Tachycardia improving.  Placed  on IV Lasix.  Continue empiric IV antibiotics.    10 bilateral pleural effusions Secondary to aggressive fluid resuscitation on presentation.  Placed on Lasix 40 mg IV every 12 hours x2 doses and reassess in the morning.  Repeat chest x-ray in the morning.  Follow clinically.  11.  Anemia Patient denies any overt GI bleed.  Check an anemia panel.  Repeat H&H this afternoon.  Transfusion threshold hemoglobin less than 7.      DVT prophylaxis: Lovenox Code Status: Full Family Communication: Updated patient and wife at bedside. Disposition Plan: Home when clinically improved, resolution of ileus versus small bowel obstruction and tolerating a solid diet, afebrile times 48 hours and per general surgery.  Hopefully in the next 1-2 days.   Consultants:   General surgery: Dr. Violeta Gelinas 06/29/2017  Procedures:  CT abdomen and pelvis 06/25/2017  Abdominal ultrasound 06/27/2017  RU Quadrant ultrasound 06/25/2017  Abdominal x-rays 06/28/2017, 06/29/2017, 06/30/2017, 07/01/2017  CT abdomen and pelvis 07/02/2017  Antimicrobials:  IV Zosyn 07/01/2017>>>> 07/03/2017  IV Unasyn 07/03/2017>>>>> 07/04/2017  Augmentin 07/04/2017   Subjective: Patient laying in bed.  NG tube has been removed.  Denies any chest pain or shortness of breath.  States having multiple loose stools yesterday.  No abdominal pain.  No nausea or vomiting.  Feeling better.  Objective: Vitals:   07/02/17 1742 07/02/17 2014 07/03/17 0502 07/03/17 0810  BP: (!) 124/91 128/85 (!) 124/97 (!) 132/91  Pulse: (!) 112 (!) 109 (!) 101 (!) 110  Resp: 18     Temp: 99.9 F (37.7 C) 98.8 F (37.1 C) 98.8 F (37.1 C) 99.5 F (37.5 C)  TempSrc: Oral Oral Oral Oral  SpO2: 96% 96% 96% 96%  Weight:  72.7 kg (160 lb 3.2 oz)    Height:        Intake/Output Summary (Last 24 hours) at 07/03/2017 1546 Last data filed at 07/03/2017 1531 Gross per 24 hour  Intake 2103.33 ml  Output 2400 ml  Net -296.67 ml   Filed Weights    07/01/17 1745 07/01/17 2050 07/02/17 2014  Weight: 75.8 kg (167 lb 3.2 oz) 75.7 kg (166 lb 14.2 oz) 72.7 kg (160 lb 3.2 oz)    Examination:  General exam: Appears calm and comfortable.  NG tube removed. Respiratory system: Decreased breath sounds in the bases.  No wheezing.  No crackles.  Fair air movement.  Respiratory effort normal. Cardiovascular system: Tachycardia.  No murmurs rubs or gallops.  No JVD.  No lower extremity edema.  Gastrointestinal system: Abdomen is softer, mildly distended, less tender to palpation, positive bowel sounds.  No rebound.  No guarding.  Central nervous system: Alert and oriented. No focal neurological deficits. Extremities: Symmetric 5 x 5 power.  Genitourinary: Patient with decreased swelling in the shaft of the penis, nontender, no drainage, circumcised, no warmth, no erythema.  Skin: No rashes, lesions or ulcers Psychiatry: Judgement and insight appear normal. Mood & affect appropriate.     Data Reviewed: I have personally reviewed following labs and imaging studies  CBC: Recent Labs  Lab 06/27/17 0625  06/29/17 0321 06/30/17 0556 07/01/17 0341 07/02/17 0436 07/03/17 0322 07/03/17 1350  WBC 7.6   < > 8.3 9.4 14.4* 13.9* 15.7*  --   NEUTROABS 5.5  --  4.8 6.4  --  10.0* 11.2*  --   HGB 11.6*   < > 9.5* 9.5* 8.2* 8.2* 7.1* 7.9*  HCT 33.4*   < > 27.7* 27.3* 23.7* 22.8* 20.2* 22.5*  MCV 96.5   < > 94.9 93.5 92.9 91.2 91.4  --   PLT 185   < > 214 263 291 376 394  --    < > = values in this interval not displayed.   Basic Metabolic Panel: Recent Labs  Lab 06/27/17 0625  06/29/17 0321 06/30/17 0556 07/01/17 0341 07/02/17 0436 07/03/17 0322  NA 136   < > 137 140 143 143 141  K 3.1*   < > 3.3* 3.6 3.9 3.7 3.2*  CL 103   < > 101 103 110 111 108  CO2 25   < > 25 25 21* 22 24  GLUCOSE 142*   < > 112* 102* 82 87 103*  BUN 10   < > 10 13 10 11 10   CREATININE 1.00   < > 0.94 1.09 1.12 0.99 1.03  CALCIUM 6.5*   < > 7.6* 8.2* 7.9* 8.2* 8.0*    MG 2.0  --  2.3  --  2.4 2.4  --    < > = values in this interval not displayed.   GFR: Estimated Creatinine Clearance: 87.7 mL/min (by C-G formula based on SCr of 1.03 mg/dL). Liver Function Tests: Recent Labs  Lab 06/28/17 0609 06/29/17 0321 06/30/17 0556 07/01/17 0341 07/03/17 0322  AST 83* 133* 81* 153* 156*  ALT 35 50 43 59 78*  ALKPHOS 50 74 74 89 89  BILITOT 1.4* 1.6* 3.8* 3.7* 1.7*  PROT 6.0* 6.1* 6.3* 6.1* 5.9*  ALBUMIN 2.4* 2.4* 2.3* 2.1* 2.0*   Recent Labs  Lab 06/27/17 0625 06/28/17 0609 06/29/17 0321 06/30/17 0556 07/01/17 0341  LIPASE 144* 127* 161* 352* 390*   No results for input(s): AMMONIA in the last 168 hours. Coagulation Profile: No results for input(s): INR, PROTIME in the last 168 hours. Cardiac Enzymes: No results for input(s): CKTOTAL, CKMB, CKMBINDEX, TROPONINI in the last 168 hours. BNP (last 3 results) No results for input(s): PROBNP in the last 8760 hours. HbA1C: No results for input(s): HGBA1C in the last 72 hours. CBG: No results for input(s): GLUCAP in the last 168 hours. Lipid Profile: No results for input(s): CHOL, HDL, LDLCALC, TRIG, CHOLHDL, LDLDIRECT in the last 72 hours. Thyroid Function Tests: No results for input(s): TSH, T4TOTAL, FREET4, T3FREE, THYROIDAB in the last 72 hours. Anemia Panel: Recent Labs    07/03/17 0901  VITAMINB12 2,098*  FOLATE 10.7  FERRITIN 2,848*  TIBC 179*  IRON 20*  RETICCTPCT 1.3   Sepsis Labs: No results for input(s): PROCALCITON, LATICACIDVEN in the last 168 hours.  Recent Results (from the past 240 hour(s))  Culture, blood (Routine X 2) w Reflex to ID Panel     Status: None (Preliminary result)   Collection Time: 06/29/17  9:01 AM  Result Value Ref Range Status   Specimen Description BLOOD LEFT ANTECUBITAL  Final   Special Requests   Final    BOTTLES DRAWN AEROBIC AND ANAEROBIC Blood Culture adequate volume   Culture   Final    NO GROWTH 4 DAYS Performed at [facility],  [address] [place], [place] [address]    Report Status PENDING  Incomplete  Culture, blood (Routine X 2) w Reflex to ID Panel     Status: None (Preliminary result)   Collection Time: 06/29/17  9:06 AM  Result Value Ref Range Status   Specimen Description BLOOD LEFT HAND  Final   Special Requests   Final    BOTTLES DRAWN AEROBIC ONLY Blood Culture adequate volume   Culture   Final    NO GROWTH 4 DAYS Performed at [facility], [address] [place], [place] [address]    Report Status PENDING  Incomplete  Urine Culture     Status: None   Collection Time: 06/29/17 11:28 AM  Result Value Ref Range Status   Specimen Description URINE, RANDOM  Final   Special Requests NONE  Final   Culture   Final    NO GROWTH Performed at [facility], [address], [place], [place] [address]    Report Status 06/30/2017 FINAL  Final         Radiology Studies: Ct Abdomen Pelvis W Contrast  Result Date: 07/02/2017 CLINICAL DATA:  Followup pancreatitis and small bowel obstruction. EXAM: CT ABDOMEN AND PELVIS WITH CONTRAST TECHNIQUE: Multidetector CT imaging of the abdomen and pelvis was performed using the standard protocol following bolus administration of intravenous contrast. CONTRAST:  ISOVUE-300 IOPAMIDOL (ISOVUE-300) INJECTION 61% COMPARISON:  06/25/2017 FINDINGS: Lower Chest: Increased bilateral pleural effusions and bibasilar atelectasis, left side greater than right. Hepatobiliary: No hepatic masses identified. Gallbladder is unremarkable. No evidence of biliary ductal dilatation. Pancreas: The pancreatic tail remains swollen with near complete absence of contrast enhancement, consistent with pancreatic necrosis. Increased multilocular fluid collections are seen within the left upper  and lower quadrants with extension along the left pelvic sidewall. Increased mild free fluid also seen in the right upper quadrant and pelvis. Spleen: Within normal limits in size and appearance.  Adrenals/Urinary Tract: No masses identified. Stable small left renal cyst. No evidence of hydronephrosis. Unremarkable unopacified urinary bladder. Stomach/Bowel: Dilated contrast filled small bowel loops are seen in the lower abdomen and pelvis, with transition point in the anterior right lower quadrant, suspicious for partial small bowel obstruction. Vascular/Lymphatic: No pathologically enlarged lymph nodes. No abdominal aortic aneurysm. Aortic atherosclerosis. Reproductive:  No mass or other significant abnormality. Other:  None. Musculoskeletal:  No suspicious bone lesions identified. IMPRESSION: Pancreatic tail necrosis, with increased multilocular fluid collections in the left abdomen and pelvis, consistent with developing pseudocysts. Increase small amount of intraperitoneal fluid also noted. Dilated small bowel loops with transition point in anterior right lower quadrant, suspicious for small bowel obstruction. Increased bilateral pleural effusions and bibasilar atelectasis. Electronically Signed   By: Myles RosenthalJohn  Stahl M.D.   On: 07/02/2017 18:28   Dg Abd 2 Views  Result Date: 07/02/2017 CLINICAL DATA:  Mid abdominal pain and swelling. Ileus. Pancreatitis. EXAM: ABDOMEN - 2 VIEW COMPARISON:  Radiographs dated 07/01/2017, 06/30/2017 and 06/29/2017 FINDINGS: NG tube has been removed. There is persistent dilatation of small bowel loops. Stomach and colon are not distended. Increased moderate left pleural effusion with consolidation in the left lower lobe. Atelectasis at the right lung base. IMPRESSION: 1. Persistent ileus. 2. Increased moderate left effusion. 3. Persistent consolidation in the left lower lobe. 4. Slight atelectasis at the right lung base, unchanged. Electronically Signed   By: Francene BoyersJames  Maxwell M.D.   On: 07/02/2017 09:11        Scheduled Meds: . atorvastatin  20 mg Oral q1800  . dextromethorphan-guaiFENesin  2 tablet Oral BID  . enoxaparin (LOVENOX) injection  40 mg Subcutaneous Q24H    . famotidine  20 mg Oral BID  . feeding supplement  1 Container Oral TID BM  . feeding supplement (PRO-STAT SUGAR FREE 64)  30 mL Oral BID  . folic acid  1 mg Oral Daily  . furosemide  40 mg Intravenous Q12H  . iopamidol  100 mL Intravenous Once  . multivitamin with minerals  1 tablet Oral Daily  . simethicone  160 mg Oral QID  . thiamine  100 mg Oral Daily   Continuous Infusions: . ampicillin-sulbactam (UNASYN) IV Stopped (07/03/17 1058)     LOS: 8 days    Time spent: 40 minutes    Ramiro Harvestaniel Bathsheba Durrett, MD [facility] Pager [phone]  If 7PM-7AM, please contact night-coverage [email or web address] Password [email or web address] 07/03/2017, 3:46 PM

## 2017-07-03 NOTE — Progress Notes (Signed)
  Progress Note: General Surgery Service   Assessment/Plan: Patient Active Problem List   Diagnosis Date Noted  . Hospital acquired PNA 07/01/2017  . Ileus, unspecified (HCC) 06/30/2017  . Abdominal distension   . SBO (small bowel obstruction) (HCC) 06/29/2017  . Fever   . Hypokalemia   . Penile swelling 06/28/2017  . Acute pancreatitis 06/25/2017  . Hyperglycemia 06/25/2017  . Alcohol abuse 06/25/2017   Ileus after pancreatitis, clinically improving- NG out, tolerating clears, having bowel movements Try full liquids today.     LOS: 8 days  Chief Complaint/Subjective: Denies nausea.emesis, continues to have bowel function   Objective: Vital signs in last 24 hours: Temp:  [98.8 F (37.1 C)-99.9 F (37.7 C)] 98.8 F (37.1 C) (04/23 0502) Pulse Rate:  [101-114] 101 (04/23 0502) Resp:  [18-19] 18 (04/22 1742) BP: (124-149)/(85-97) 124/97 (04/23 0502) SpO2:  [94 %-96 %] 96 % (04/23 0502) Weight:  [72.7 kg (160 lb 3.2 oz)] 72.7 kg (160 lb 3.2 oz) (04/22 2014) Last BM Date: 07/02/17  Intake/Output from previous day: 04/22 0701 - 04/23 0700 In: 1183.3 [P.O.:300; I.V.:883.3] Out: 1200 [Urine:1200] Intake/Output this shift: No intake/output data recorded.  Lungs: CTAB  Cardiovascular: rrr  Abd: soft, mildly distended, nontender  Extremities: no edema  Neuro: AOx4  Lab Results: CBC  Recent Labs    07/02/17 0436 07/03/17 0322  WBC 13.9* 15.7*  HGB 8.2* 7.1*  HCT 22.8* 20.2*  PLT 376 394   BMET Recent Labs    07/02/17 0436 07/03/17 0322  NA 143 141  K 3.7 3.2*  CL 111 108  CO2 22 24  GLUCOSE 87 103*  BUN 11 10  CREATININE 0.99 1.03  CALCIUM 8.2* 8.0*   PT/INR No results for input(s): LABPROT, INR in the last 72 hours. ABG No results for input(s): PHART, HCO3 in the last 72 hours.  Invalid input(s): PCO2, PO2  Studies/Results:  Anti-infectives: Anti-infectives (From admission, onward)   Start     Dose/Rate Route Frequency Ordered Stop   07/01/17 1300  piperacillin-tazobactam (ZOSYN) IVPB 3.375 g     3.375 g 12.5 mL/hr over 240 Minutes Intravenous Every 8 hours 07/01/17 1209        Medications: Scheduled Meds: . atorvastatin  20 mg Oral q1800  . dextromethorphan-guaiFENesin  2 tablet Oral BID  . enoxaparin (LOVENOX) injection  40 mg Subcutaneous Q24H  . famotidine  20 mg Oral BID  . feeding supplement  1 Container Oral TID BM  . feeding supplement (PRO-STAT SUGAR FREE 64)  30 mL Oral BID  . folic acid  1 mg Oral Daily  . iopamidol  100 mL Intravenous Once  . multivitamin with minerals  1 tablet Oral Daily  . simethicone  160 mg Oral QID  . thiamine  100 mg Oral Daily   Continuous Infusions: . 0.9 % NaCl with KCl 40 mEq / L 50 mL/hr (07/03/17 0142)  . piperacillin-tazobactam (ZOSYN)  IV 3.375 g (07/03/17 0515)   PRN Meds:.acetaminophen **OR** acetaminophen, HYDROcodone-homatropine, ipratropium, ketorolac, levalbuterol, metoprolol tartrate, morphine injection, ondansetron (ZOFRAN) IV, oxyCODONE  Berna Buehelsea A Diontay Rosencrans, MD [facility]

## 2017-07-04 ENCOUNTER — Inpatient Hospital Stay (HOSPITAL_COMMUNITY): Payer: Self-pay

## 2017-07-04 LAB — CBC
HEMATOCRIT: 21.3 % — AB (ref 39.0–52.0)
HEMOGLOBIN: 7.4 g/dL — AB (ref 13.0–17.0)
MCH: 31.6 pg (ref 26.0–34.0)
MCHC: 34.7 g/dL (ref 30.0–36.0)
MCV: 91 fL (ref 78.0–100.0)
Platelets: 453 10*3/uL — ABNORMAL HIGH (ref 150–400)
RBC: 2.34 MIL/uL — ABNORMAL LOW (ref 4.22–5.81)
RDW: 14 % (ref 11.5–15.5)
WBC: 15.6 10*3/uL — ABNORMAL HIGH (ref 4.0–10.5)

## 2017-07-04 LAB — COMPREHENSIVE METABOLIC PANEL
ALBUMIN: 2 g/dL — AB (ref 3.5–5.0)
ALK PHOS: 98 U/L (ref 38–126)
ALT: 80 U/L — AB (ref 17–63)
ANION GAP: 12 (ref 5–15)
AST: 137 U/L — ABNORMAL HIGH (ref 15–41)
BILIRUBIN TOTAL: 1.3 mg/dL — AB (ref 0.3–1.2)
BUN: 9 mg/dL (ref 6–20)
CALCIUM: 8.2 mg/dL — AB (ref 8.9–10.3)
CO2: 25 mmol/L (ref 22–32)
Chloride: 102 mmol/L (ref 101–111)
Creatinine, Ser: 0.89 mg/dL (ref 0.61–1.24)
GFR calc Af Amer: >60 mL/min (ref >60)
GFR calc non Af Amer: >60 mL/min (ref >60)
GLUCOSE: 103 mg/dL — AB (ref 65–99)
Potassium: 3.2 mmol/L — ABNORMAL LOW (ref 3.5–5.1)
SODIUM: 139 mmol/L (ref 135–145)
TOTAL PROTEIN: 6.3 g/dL — AB (ref 6.5–8.1)

## 2017-07-04 LAB — CULTURE, BLOOD (ROUTINE X 2)
CULTURE: NO GROWTH
Culture: NO GROWTH
SPECIAL REQUESTS: ADEQUATE
Special Requests: ADEQUATE

## 2017-07-04 LAB — MAGNESIUM: Magnesium: 1.9 mg/dL (ref 1.7–2.4)

## 2017-07-04 MED ORDER — AMOXICILLIN-POT CLAVULANATE 875-125 MG PO TABS
1.0000 | ORAL_TABLET | Freq: Two times a day (BID) | ORAL | Status: DC
  Administered 2017-07-04 – 2017-07-05 (×3): 1 via ORAL
  Filled 2017-07-04 (×3): qty 1

## 2017-07-04 MED ORDER — POTASSIUM CHLORIDE CRYS ER 20 MEQ PO TBCR
40.0000 meq | EXTENDED_RELEASE_TABLET | ORAL | Status: AC
  Administered 2017-07-04 (×2): 40 meq via ORAL
  Filled 2017-07-04 (×2): qty 2

## 2017-07-04 NOTE — Final Consult Note (Addendum)
[facility] Progress Note     Subjective: CC: ileus Patient denies abdominal pain, n/v. Bloating is improved. Had 4 BMs yesterday. Tolerating FLD. Denies cough, chest pain, SOB. UOP good. VSS.   Objective: Vital signs in last 24 hours: Temp:  [99.3 F (37.4 C)-100.1 F (37.8 C)] 99.5 F (37.5 C) (04/24 0506) Pulse Rate:  [103-116] 103 (04/24 0506) Resp:  [16-18] 18 (04/24 0506) BP: (111-133)/(88-93) 133/93 (04/24 0506) SpO2:  [96 %-98 %] 97 % (04/24 0506) Weight:  [72.7 kg (160 lb 3 oz)] 72.7 kg (160 lb 3 oz) (04/23 2019) Last BM Date: 07/03/17  Intake/Output from previous day: 04/23 0701 - 04/24 0700 In: 920 [P.O.:820; IV Piggyback:100] Out: 1700 [Urine:1700] Intake/Output this shift: No intake/output data recorded.  PE: Gen:  Alert, NAD, pleasant Card: sinus tachycardia, pedal pulses 2+ BL Pulm:  Normal effort, clear to auscultation bilaterally Abd: Soft, non-tender, non-distended, bowel sounds present, no HSM Skin: warm and dry, no rashes  Psych: A&Ox3   Lab Results:  Recent Labs    07/03/17 0322 07/03/17 1350 07/04/17 0432  WBC 15.7*  --  15.6*  HGB 7.1* 7.9* 7.4*  HCT 20.2* 22.5* 21.3*  PLT 394  --  453*   BMET Recent Labs    07/03/17 0322 07/04/17 0432  NA 141 139  K 3.2* 3.2*  CL 108 102  CO2 24 25  GLUCOSE 103* 103*  BUN 10 9  CREATININE 1.03 0.89  CALCIUM 8.0* 8.2*   PT/INR No results for input(s): LABPROT, INR in the last 72 hours. CMP     Component Value Date/Time   NA 139 07/04/2017 0432   K 3.2 (L) 07/04/2017 0432   CL 102 07/04/2017 0432   CO2 25 07/04/2017 0432   GLUCOSE 103 (H) 07/04/2017 0432   BUN 9 07/04/2017 0432   CREATININE 0.89 07/04/2017 0432   CALCIUM 8.2 (L) 07/04/2017 0432   PROT 6.3 (L) 07/04/2017 0432   ALBUMIN 2.0 (L) 07/04/2017 0432   AST 137 (H) 07/04/2017 0432   ALT 80 (H) 07/04/2017 0432   ALKPHOS 98 07/04/2017 0432   BILITOT 1.3 (H) 07/04/2017 0432   GFRNONAA >60 07/04/2017 0432   GFRAA  >60 07/04/2017 0432   Lipase     Component Value Date/Time   LIPASE 390 (H) 07/01/2017 0341       Studies/Results: Ct Abdomen Pelvis W Contrast  Result Date: 07/02/2017 CLINICAL DATA:  Followup pancreatitis and small bowel obstruction. EXAM: CT ABDOMEN AND PELVIS WITH CONTRAST TECHNIQUE: Multidetector CT imaging of the abdomen and pelvis was performed using the standard protocol following bolus administration of intravenous contrast. CONTRAST:  ISOVUE-300 IOPAMIDOL (ISOVUE-300) INJECTION 61% COMPARISON:  06/25/2017 FINDINGS: Lower Chest: Increased bilateral pleural effusions and bibasilar atelectasis, left side greater than right. Hepatobiliary: No hepatic masses identified. Gallbladder is unremarkable. No evidence of biliary ductal dilatation. Pancreas: The pancreatic tail remains swollen with near complete absence of contrast enhancement, consistent with pancreatic necrosis. Increased multilocular fluid collections are seen within the left upper and lower quadrants with extension along the left pelvic sidewall. Increased mild free fluid also seen in the right upper quadrant and pelvis. Spleen: Within normal limits in size and appearance. Adrenals/Urinary Tract: No masses identified. Stable small left renal cyst. No evidence of hydronephrosis. Unremarkable unopacified urinary bladder. Stomach/Bowel: Dilated contrast filled small bowel loops are seen in the lower abdomen and pelvis, with transition point in the anterior right lower quadrant, suspicious for partial small bowel obstruction. Vascular/Lymphatic: No pathologically  enlarged lymph nodes. No abdominal aortic aneurysm. Aortic atherosclerosis. Reproductive:  No mass or other significant abnormality. Other:  None. Musculoskeletal:  No suspicious bone lesions identified. IMPRESSION: Pancreatic tail necrosis, with increased multilocular fluid collections in the left abdomen and pelvis, consistent with developing pseudocysts. Increase small  amount of intraperitoneal fluid also noted. Dilated small bowel loops with transition point in anterior right lower quadrant, suspicious for small bowel obstruction. Increased bilateral pleural effusions and bibasilar atelectasis. Electronically Signed   By: Myles RosenthalJohn  Stahl M.D.   On: 07/02/2017 18:28   Dg Abd 2 Views  Result Date: 07/02/2017 CLINICAL DATA:  Mid abdominal pain and swelling. Ileus. Pancreatitis. EXAM: ABDOMEN - 2 VIEW COMPARISON:  Radiographs dated 07/01/2017, 06/30/2017 and 06/29/2017 FINDINGS: NG tube has been removed. There is persistent dilatation of small bowel loops. Stomach and colon are not distended. Increased moderate left pleural effusion with consolidation in the left lower lobe. Atelectasis at the right lung base. IMPRESSION: 1. Persistent ileus. 2. Increased moderate left effusion. 3. Persistent consolidation in the left lower lobe. 4. Slight atelectasis at the right lung base, unchanged. Electronically Signed   By: Francene BoyersJames  Maxwell M.D.   On: 07/02/2017 09:11    Anti-infectives: Anti-infectives (From admission, onward)   Start     Dose/Rate Route Frequency Ordered Stop   07/03/17 1000  Ampicillin-Sulbactam (UNASYN) 3 g in sodium chloride 0.9 % 100 mL IVPB     3 g 200 mL/hr over 30 Minutes Intravenous Every 6 hours 07/03/17 0949     07/01/17 1300  piperacillin-tazobactam (ZOSYN) IVPB 3.375 g  Status:  Discontinued     3.375 g 12.5 mL/hr over 240 Minutes Intravenous Every 8 hours 07/01/17 1209 07/03/17 0949       Assessment/Plan Patient Active Problem List   Diagnosis Date Noted  . Hospital acquired PNA 07/01/2017  . Ileus, unspecified (HCC) 06/30/2017  . Abdominal distension   . SBO (small bowel obstruction) (HCC) 06/29/2017  . Fever   . Hypokalemia   . Penile swelling 06/28/2017  . Acute pancreatitis 06/25/2017  . Hyperglycemia 06/25/2017  . Alcohol abuse 06/25/2017    Ileus secondary to EtOH pancreatitis - tolerating FLD and having bowel function -  advance to soft diet - replace K PO  FEN: soft diet VTE: SCDs, lovenox ID: Zosyn 4/21>4/23; Unasyn 4/23>>  Plan: advance to soft diet, ok to discharge from our standpoint if tolerating soft. Recommend soft diet for a week after discharge. Replace K and recheck BMET. No indications for surgical intervention, we will sign off. Please call with questions or concerns.   LOS: 9 days    Wells GuilesKelly Rayburn , [facility] 07/04/2017, 8:08 AM Pager: [phone] Consults: [phone] Mon-Fri 7:00 am-4:30 pm Sat-Sun 7:00 am-11:30 am

## 2017-07-04 NOTE — Progress Notes (Signed)
PROGRESS NOTE    Ivan Harper  ZOX:[id number] DOB: 1970-12-08 DOA: 06/25/2017 PCP: Patient, No Pcp Per    Brief Narrative:  Ivan Rosamond Morganis a 47 y.o.malewithno significant pastmedical historypresenting with abdominal pain, found to have acute pancreatitis, initial lipase greater than 1000. CT abdomen pelvis concerning for pancreatic tail necrosis with multilocular fluid collections in the left abdomen and the pelvis consistent with developing pseudocyst  Patient improved clinically in terms of his pancreatitis however continue to have distended abdomen and abdominal tightness.  Abdominal films obtained consistent with a high-grade small bowel obstruction.  Patient made n.p.o.  NG tube placed.  General surgery consulted.      Assessment & Plan:   Principal Problem:   Ileus, unspecified (HCC) Active Problems:   Acute pancreatitis   Hyperglycemia   Alcohol abuse   Penile swelling   SBO (small bowel obstruction) (HCC)   Fever   Hypokalemia   Abdominal distension   Hospital acquired PNA  #1  Ileus secondary to acute pancreatitis versus small bowel obstruction Patient with ileus likely complication from his acute pancreatitis.  Patient states had his appendix taken out in approximately 2008   Concern for complication of pancreatitis lipase levels trending back up.  LFTs trending back up however seem to be plateauing.     Patient still with a leukocytosis and low-grade fever. 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 IV fluids discontinued, was given    Lasix 40 mg IV every 12 hours x2 doses as patient seems slightly volume overloaded with bilateral pleural effusions noted on CT abdomen and pelvis.   Patient states having bowel movements  .  Diet has been advanced to soft which he is tolerating.  General surgery following and appreciate input and recommendations.   2 acute pancreatitis with necrosis, developing pseudocyst  Likely secondary to alcohol use.  Patient  presented abdominal pain nausea and emesis.  Lipase levels elevated at 1230 with a AST of 133 and ALT of 56 on presentation.  CT abdomen and pelvis done with severe pancreatitis of the distal body and pancreatic tail with large amount of fluid around the distal pancreas.  Areas of hypo-enhancement in the distal body of the pancreas concerning for pancreatic necrosis.  CT abdomen and pelvis negative for gallstones or thickened gallbladder wall.  Triglycerides of 233.    Patient subsequently   developed   ileus versus high-grade small bowel obstruction.  Patient improving slowly however still with low-grade fevers, leukocytosis which may be secondary to airspace disease however due to worsening acute pancreatitis with resultant ileus CT abdomen and pelvis were repeated that showed pancreatic necrosis and formation of pseudocyst.  Patient improving clinically,  advanced  full liquid diet   to soft diet which he  is tolerating.   .  General surgery following and appreciate input and recommendations.   3. Low-grade fever could be secondary to pancreatic necrosis? Will continue Unasyn Urine culture negative this admission Blood culture no growth since 4/19  4.  Alcohol abuse Alcohol cessation was stressed to patient.  Due to abdominal distention abdominal ultrasound was obtained which was negative for ascites however consistent with pancreatitis, fluid filled small bowel, scattered intraperitoneal fluid, no evidence of gallstones.  No evidence of cirrhosis.  Patient with no signs of withdrawal.  Continue ativan withdrawal protocol.  Follow.  5.  Hypokalemia Likely secondary to diuretics which were given.  Replete potassium.   .  6.  Hyperlycemia Likely reactive.  Hemoglobin A1c of 4.8.  Follow.  7.  penile swelling Question onset.  Likely secondary to edema in the setting of low albumin.  Patient is circumcised.  Denies any pain.  No discharge.  Patient is currently afebrile.  Able to urinate without any  problems.  No dysuria.  Swelling improved. Follow.    8.  Abdominal distention Secondary to ileus versus small bowel obstruction.  See #1.  Give 2 doses of IV Lasix 40 mg every 12x2.   9.  Low-grade fever secondary to HCAP versus aspiration pneumonia Patient noted to have a low-grade temperature of 100.8   on and off since 06/29/2017.    Blood cultures with no growth to date.  Patient with a cough.  No films reviewed and concerning for left basilar infiltrate and moderate effusion.  Patient also noted to have a leukocytosis with a white count of 15.7 from 13.9 from 14.4.  Sputum Gram stain and cultures pending.  Urine Legionella antigen negative.  Urine pneumococcus antigen negative.   . Repeat abdominal films concerning for airspace disease.  Continue Mucinex, scheduled nebs.  Change IV Zosyn to IV Unasyn and  transition to oral Augmentin  today for another 5 days.  CT abdomen and pelvis showing formation of pseudocyst.  Patient however improving clinically.    10.  Sinus tachycardia Likely secondary to problems #1 and 2 probable pneumonia.  IV fluids have been saline locked.  Repeat  CT abdomen and pelvis with pancreatic tail necrosis, increased multiloculated fluid collections in the left abdomen and pelvis consistent with developing pseudocyst.  Increase small amount of intraperitoneal fluid is also noted.  Dilated small bowel loops with transition point anterior right lower quadrant suspicious for small bowel obstruction.  Increased bilateral pleural effusions and bibasilar atelectasis. Repeat chest x-ray. 4/24. Unchanged small to moderate left pleural effusion with associated atelectasis..   Low suspicion for PE, has been on Lovenox for DVT prophylaxis    11.  Anemia Patient denies any overt GI bleed.   Anemia panel shows high ferritin likely acute phase reactant, Ion is 20.  Hemoglobin trending down, follow CBC closely.  Transfusion threshold hemoglobin less than 7.      DVT prophylaxis:  Lovenox Code Status: Full Family Communication: Updated patient and wife at bedside. Disposition Plan:Advancing diet, currently not stable for discharge, low-grade fever     Consultants:   General surgery: Dr. Violeta Gelinas 06/29/2017  Procedures:  CT abdomen and pelvis 06/25/2017  Abdominal ultrasound 06/27/2017  RU Quadrant ultrasound 06/25/2017  Abdominal x-rays 06/28/2017, 06/29/2017, 06/30/2017, 07/01/2017  CT abdomen and pelvis 07/02/2017  Antimicrobials:  IV Zosyn 07/01/2017>>>> 07/03/2017  IV Unasyn 07/03/2017>>>>> 07/04/2017  Augmentin 07/04/2017   Subjective: Patient laying in bed.  NG tube has been removed.  Diarrhea is somewhat better.   Objective: Vitals:   07/03/17 1554 07/03/17 2019 07/04/17 0506 07/04/17 0940  BP: (!) 111/92 127/88 (!) 133/93 137/76  Pulse: (!) 113 (!) 116 (!) 103 (!) 111  Resp: 18 16 18 18   Temp: 99.3 F (37.4 C) 100.1 F (37.8 C) 99.5 F (37.5 C) 99.4 F (37.4 C)  TempSrc: Oral Oral Oral Oral  SpO2: 98% 96% 97% 97%  Weight:  72.7 kg (160 lb 3 oz)    Height:        Intake/Output Summary (Last 24 hours) at 07/04/2017 0957 Last data filed at 07/04/2017 0517 Gross per 24 hour  Intake 560 ml  Output 1700 ml  Net -1140 ml   Filed Weights   07/01/17 2050 07/02/17 2014 07/03/17  2019  Weight: 75.7 kg (166 lb 14.2 oz) 72.7 kg (160 lb 3.2 oz) 72.7 kg (160 lb 3 oz)    Examination:  General exam: Appears calm and comfortable.  NG tube removed. Respiratory system: Decreased breath sounds in the bases.  No wheezing.  No crackles.  Fair air movement.  Respiratory effort normal. Cardiovascular system: Tachycardia.  No murmurs rubs or gallops.  No JVD.  No lower extremity edema.  Gastrointestinal system: Abdomen is softer, mildly distended, less tender to palpation, positive bowel sounds.  No rebound.  No guarding.  Central nervous system: Alert and oriented. No focal neurological deficits. Extremities: Symmetric 5 x 5 power.  Genitourinary:  Patient with decreased swelling in the shaft of the penis, nontender, no drainage, circumcised, no warmth, no erythema.  Skin: No rashes, lesions or ulcers Psychiatry: Judgement and insight appear normal. Mood & affect appropriate.     Data Reviewed: I have personally reviewed following labs and imaging studies  CBC: Recent Labs  Lab 06/29/17 0321 06/30/17 0556 07/01/17 0341 07/02/17 0436 07/03/17 0322 07/03/17 1350 07/04/17 0432  WBC 8.3 9.4 14.4* 13.9* 15.7*  --  15.6*  NEUTROABS 4.8 6.4  --  10.0* 11.2*  --   --   HGB 9.5* 9.5* 8.2* 8.2* 7.1* 7.9* 7.4*  HCT 27.7* 27.3* 23.7* 22.8* 20.2* 22.5* 21.3*  MCV 94.9 93.5 92.9 91.2 91.4  --  91.0  PLT 214 263 291 376 394  --  453*   Basic Metabolic Panel: Recent Labs  Lab 06/29/17 0321 06/30/17 0556 07/01/17 0341 07/02/17 0436 07/03/17 0322 07/04/17 0432  NA 137 140 143 143 141 139  K 3.3* 3.6 3.9 3.7 3.2* 3.2*  CL 101 103 110 111 108 102  CO2 25 25 21* 22 24 25   GLUCOSE 112* 102* 82 87 103* 103*  BUN 10 13 10 11 10 9   CREATININE 0.94 1.09 1.12 0.99 1.03 0.89  CALCIUM 7.6* 8.2* 7.9* 8.2* 8.0* 8.2*  MG 2.3  --  2.4 2.4  --  1.9   GFR: Estimated Creatinine Clearance: 101.5 mL/min (by C-G formula based on SCr of 0.89 mg/dL). Liver Function Tests: Recent Labs  Lab 06/29/17 0321 06/30/17 0556 07/01/17 0341 07/03/17 0322 07/04/17 0432  AST 133* 81* 153* 156* 137*  ALT 50 43 59 78* 80*  ALKPHOS 74 74 89 89 98  BILITOT 1.6* 3.8* 3.7* 1.7* 1.3*  PROT 6.1* 6.3* 6.1* 5.9* 6.3*  ALBUMIN 2.4* 2.3* 2.1* 2.0* 2.0*   Recent Labs  Lab 06/28/17 0609 06/29/17 0321 06/30/17 0556 07/01/17 0341  LIPASE 127* 161* 352* 390*   No results for input(s): AMMONIA in the last 168 hours. Coagulation Profile: No results for input(s): INR, PROTIME in the last 168 hours. Cardiac Enzymes: No results for input(s): CKTOTAL, CKMB, CKMBINDEX, TROPONINI in the last 168 hours. BNP (last 3 results) No results for input(s): PROBNP in the  last 8760 hours. HbA1C: No results for input(s): HGBA1C in the last 72 hours. CBG: No results for input(s): GLUCAP in the last 168 hours. Lipid Profile: No results for input(s): CHOL, HDL, LDLCALC, TRIG, CHOLHDL, LDLDIRECT in the last 72 hours. Thyroid Function Tests: No results for input(s): TSH, T4TOTAL, FREET4, T3FREE, THYROIDAB in the last 72 hours. Anemia Panel: Recent Labs    07/03/17 0901  VITAMINB12 2,098*  FOLATE 10.7  FERRITIN 2,848*  TIBC 179*  IRON 20*  RETICCTPCT 1.3   Sepsis Labs: No results for input(s): PROCALCITON, LATICACIDVEN in the last 168 hours.  Recent  Results (from the past 240 hour(s))  Culture, blood (Routine X 2) w Reflex to ID Panel     Status: None (Preliminary result)   Collection Time: 06/29/17  9:01 AM  Result Value Ref Range Status   Specimen Description BLOOD LEFT ANTECUBITAL  Final   Special Requests   Final    BOTTLES DRAWN AEROBIC AND ANAEROBIC Blood Culture adequate volume   Culture   Final    NO GROWTH 4 DAYS Performed at [facility], [address] [place], [place] [address]    Report Status PENDING  Incomplete  Culture, blood (Routine X 2) w Reflex to ID Panel     Status: None (Preliminary result)   Collection Time: 06/29/17  9:06 AM  Result Value Ref Range Status   Specimen Description BLOOD LEFT HAND  Final   Special Requests   Final    BOTTLES DRAWN AEROBIC ONLY Blood Culture adequate volume   Culture   Final    NO GROWTH 4 DAYS Performed at [facility], [address] [place], [place] [address]    Report Status PENDING  Incomplete  Urine Culture     Status: None   Collection Time: 06/29/17 11:28 AM  Result Value Ref Range Status   Specimen Description URINE, RANDOM  Final   Special Requests NONE  Final   Culture   Final    NO GROWTH Performed at [facility], [address], [place], [place] [address]    Report Status 06/30/2017 FINAL  Final         Radiology Studies: Ct Abdomen Pelvis W  Contrast  Result Date: 07/02/2017 CLINICAL DATA:  Followup pancreatitis and small bowel obstruction. EXAM: CT ABDOMEN AND PELVIS WITH CONTRAST TECHNIQUE: Multidetector CT imaging of the abdomen and pelvis was performed using the standard protocol following bolus administration of intravenous contrast. CONTRAST:  ISOVUE-300 IOPAMIDOL (ISOVUE-300) INJECTION 61% COMPARISON:  06/25/2017 FINDINGS: Lower Chest: Increased bilateral pleural effusions and bibasilar atelectasis, left side greater than right. Hepatobiliary: No hepatic masses identified. Gallbladder is unremarkable. No evidence of biliary ductal dilatation. Pancreas: The pancreatic tail remains swollen with near complete absence of contrast enhancement, consistent with pancreatic necrosis. Increased multilocular fluid collections are seen within the left upper and lower quadrants with extension along the left pelvic sidewall. Increased mild free fluid also seen in the right upper quadrant and pelvis. Spleen: Within normal limits in size and appearance. Adrenals/Urinary Tract: No masses identified. Stable small left renal cyst. No evidence of hydronephrosis. Unremarkable unopacified urinary bladder. Stomach/Bowel: Dilated contrast filled small bowel loops are seen in the lower abdomen and pelvis, with transition point in the anterior right lower quadrant, suspicious for partial small bowel obstruction. Vascular/Lymphatic: No pathologically enlarged lymph nodes. No abdominal aortic aneurysm. Aortic atherosclerosis. Reproductive:  No mass or other significant abnormality. Other:  None. Musculoskeletal:  No suspicious bone lesions identified. IMPRESSION: Pancreatic tail necrosis, with increased multilocular fluid collections in the left abdomen and pelvis, consistent with developing pseudocysts. Increase small amount of intraperitoneal fluid also noted. Dilated small bowel loops with transition point in anterior right lower quadrant, suspicious for small  bowel obstruction. Increased bilateral pleural effusions and bibasilar atelectasis. Electronically Signed   By: Myles Rosenthal M.D.   On: 07/02/2017 18:28        Scheduled Meds: . atorvastatin  20 mg Oral q1800  . dextromethorphan-guaiFENesin  2 tablet Oral BID  . enoxaparin (LOVENOX) injection  40 mg Subcutaneous Q24H  .  famotidine  20 mg Oral BID  . feeding supplement  1 Container Oral TID BM  . feeding supplement (PRO-STAT SUGAR FREE 64)  30 mL Oral BID  . folic acid  1 mg Oral Daily  . iopamidol  100 mL Intravenous Once  . multivitamin with minerals  1 tablet Oral Daily  . potassium chloride  40 mEq Oral Q4H  . simethicone  160 mg Oral QID  . thiamine  100 mg Oral Daily   Continuous Infusions: . ampicillin-sulbactam (UNASYN) IV Stopped (07/04/17 0455)     LOS: 9 days    Time spent: 40 minutes    Richarda OverlieNayana Hamp Moreland, MD [facility] Pager [phone]  If 7PM-7AM, please contact night-coverage [email or web address] Password [email or web address] 07/04/2017, 9:57 AM

## 2017-07-05 DIAGNOSIS — K567 Ileus, unspecified: Secondary | ICD-10-CM

## 2017-07-05 DIAGNOSIS — F1010 Alcohol abuse, uncomplicated: Secondary | ICD-10-CM

## 2017-07-05 DIAGNOSIS — R140 Abdominal distension (gaseous): Secondary | ICD-10-CM

## 2017-07-05 LAB — CBC WITH DIFFERENTIAL/PLATELET
BASOS ABS: 0.2 10*3/uL — AB (ref 0.0–0.1)
Basophils Relative: 1 %
EOS ABS: 0.2 10*3/uL (ref 0.0–0.7)
Eosinophils Relative: 1 %
HCT: 22 % — ABNORMAL LOW (ref 39.0–52.0)
HEMOGLOBIN: 7.6 g/dL — AB (ref 13.0–17.0)
LYMPHS ABS: 3.1 10*3/uL (ref 0.7–4.0)
Lymphocytes Relative: 17 %
MCH: 31.7 pg (ref 26.0–34.0)
MCHC: 34.5 g/dL (ref 30.0–36.0)
MCV: 91.7 fL (ref 78.0–100.0)
Monocytes Absolute: 2.4 10*3/uL — ABNORMAL HIGH (ref 0.1–1.0)
Monocytes Relative: 13 %
NEUTROS ABS: 12.3 10*3/uL — AB (ref 1.7–7.7)
Neutrophils Relative %: 68 %
PLATELETS: 507 10*3/uL — AB (ref 150–400)
RBC: 2.4 MIL/uL — ABNORMAL LOW (ref 4.22–5.81)
RDW: 14.2 % (ref 11.5–15.5)
WBC: 18.2 10*3/uL — ABNORMAL HIGH (ref 4.0–10.5)

## 2017-07-05 LAB — COMPREHENSIVE METABOLIC PANEL
ALK PHOS: 112 U/L (ref 38–126)
ALT: 78 U/L — ABNORMAL HIGH (ref 17–63)
ANION GAP: 9 (ref 5–15)
AST: 123 U/L — ABNORMAL HIGH (ref 15–41)
Albumin: 2.1 g/dL — ABNORMAL LOW (ref 3.5–5.0)
BUN: 8 mg/dL (ref 6–20)
CALCIUM: 8.5 mg/dL — AB (ref 8.9–10.3)
CO2: 22 mmol/L (ref 22–32)
Chloride: 106 mmol/L (ref 101–111)
Creatinine, Ser: 0.86 mg/dL (ref 0.61–1.24)
GFR calc non Af Amer: >60 mL/min (ref >60)
Glucose, Bld: 106 mg/dL — ABNORMAL HIGH (ref 65–99)
Potassium: 3.8 mmol/L (ref 3.5–5.1)
SODIUM: 137 mmol/L (ref 135–145)
Total Bilirubin: 1.4 mg/dL — ABNORMAL HIGH (ref 0.3–1.2)
Total Protein: 6.8 g/dL (ref 6.5–8.1)

## 2017-07-05 LAB — PROCALCITONIN: PROCALCITONIN: 0.85 ng/mL

## 2017-07-05 MED ORDER — ALBUTEROL SULFATE HFA 108 (90 BASE) MCG/ACT IN AERS: 2.0000 | INHALATION_SPRAY | Freq: Four times a day (QID) | RESPIRATORY_TRACT | 2 refills | Status: DC | PRN

## 2017-07-05 MED ORDER — POTASSIUM CHLORIDE CRYS ER 20 MEQ PO TBCR
40.0000 meq | EXTENDED_RELEASE_TABLET | Freq: Two times a day (BID) | ORAL | Status: DC
  Administered 2017-07-05: 40 meq via ORAL
  Filled 2017-07-05: qty 2

## 2017-07-05 MED ORDER — FOLIC ACID 1 MG PO TABS: 1.0000 mg | ORAL_TABLET | Freq: Every day | ORAL | 1 refills | Status: DC

## 2017-07-05 MED ORDER — SIMETHICONE 80 MG PO CHEW: 160.0000 mg | CHEWABLE_TABLET | Freq: Four times a day (QID) | ORAL | 0 refills | Status: DC

## 2017-07-05 MED ORDER — THIAMINE HCL 100 MG PO TABS: 100.0000 mg | ORAL_TABLET | Freq: Every day | ORAL | 1 refills | Status: DC

## 2017-07-05 MED ORDER — ATORVASTATIN CALCIUM 20 MG PO TABS: 20.0000 mg | ORAL_TABLET | Freq: Every day | ORAL | 2 refills | Status: DC

## 2017-07-05 MED ORDER — PRO-STAT SUGAR FREE PO LIQD: 30.0000 mL | Freq: Two times a day (BID) | ORAL | 0 refills | Status: DC

## 2017-07-05 MED ORDER — HYDROCODONE-HOMATROPINE 5-1.5 MG/5ML PO SYRP: 5.0000 mL | ORAL_SOLUTION | ORAL | 0 refills | Status: DC | PRN

## 2017-07-05 MED ORDER — POTASSIUM CHLORIDE CRYS ER 20 MEQ PO TBCR: 40.0000 meq | EXTENDED_RELEASE_TABLET | Freq: Two times a day (BID) | ORAL | 1 refills | Status: DC

## 2017-07-05 MED ORDER — ADULT MULTIVITAMIN W/MINERALS CH: 1.0000 | ORAL_TABLET | Freq: Every day | ORAL | 1 refills | Status: DC

## 2017-07-05 MED ORDER — AMOXICILLIN-POT CLAVULANATE 875-125 MG PO TABS: 1.0000 | ORAL_TABLET | Freq: Two times a day (BID) | ORAL | 0 refills | Status: AC

## 2017-07-05 MED ORDER — DM-GUAIFENESIN ER 30-600 MG PO TB12: 2.0000 | ORAL_TABLET | Freq: Two times a day (BID) | ORAL | 1 refills | Status: DC

## 2017-07-05 MED ORDER — FAMOTIDINE 20 MG PO TABS: 20.0000 mg | ORAL_TABLET | Freq: Two times a day (BID) | ORAL | 1 refills | Status: DC

## 2017-07-05 NOTE — Progress Notes (Signed)
SATURATION QUALIFICATIONS: (This note is used to comply with regulatory documentation for home oxygen)  Patient Saturations on Room Air at Rest = 100%  Patient Saturations on Room Air while Ambulating = 90 %  Patient Saturations on N/A Liters of oxygen while Ambulating = N/A%  Please briefly explain why patient needs home oxygen:

## 2017-07-05 NOTE — Progress Notes (Signed)
In to speak with patient regarding medications upon discharge.  Patient states he has no medical insurance at this time.  Has issues paying for his medications.  [facility] including usage limit of once every 12 months only; patient verbalized understanding.  NCM provided patient with MATCH PROGRAM LETTER.  Also explained that MATCH does not cover over the counter medications or narcotics.  Patient verbalized understanding.  Cloyde ReamsKimberly Jama Mcmiller, BSN, RN

## 2017-07-05 NOTE — Discharge Summary (Addendum)
Physician Discharge Summary  RAMEL TOBON MRN: [id number] DOB/AGE: [birth year] 47 y.o.  PCP: Patient, No Pcp Per   Admit date: 06/25/2017 Discharge date: 07/05/2017  Discharge Diagnoses:   Principal Problem:   Ileus, unspecified ([place]) Active Problems:   Acute pancreatitis   Hyperglycemia   Alcohol abuse   Penile swelling   SBO (small bowel obstruction) (H[facility])   Fever   Hypokalemia   Abdominal distension   Hospital acquired PNA    Follow-up recommendations Follow-up with PCP in 3-5 days , including all  additional recommended appointments as below Follow-up CBC, CMP, magnesium in 3-5 days Patient advised to continue with soft diet  patient to continue with Augmentin for another 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   amoxicillin-clavulanate 875-125 MG tablet Commonly known as:  AUGMENTIN Take 1 tablet by mouth every 12 (twelve) hours for 7 days.   atorvastatin 20 MG tablet Commonly known as:  LIPITOR Take 1 tablet (20 mg total) by mouth daily at 6 PM.   dextromethorphan-guaiFENesin 30-600 MG 12hr tablet Commonly known as:  MUCINEX DM Take 2 tablets by mouth 2 (two) times daily.   famotidine 20 MG tablet Commonly known as:  PEPCID Take 1 tablet (20 mg total) by mouth 2 (two) times daily.   feeding supplement (PRO-STAT SUGAR FREE 64) Liqd Take 30 mLs by mouth 2 (two) times daily.   folic acid 1 MG tablet Commonly known as:  FOLVITE Take 1 tablet (1 mg total) by mouth daily.   HYDROcodone-homatropine 5-1.5 MG/5ML syrup Commonly known as:   HYCODAN Take 5 mLs by mouth every 4 (four) hours as needed for cough.   multivitamin with minerals Tabs tablet Take 1 tablet by mouth daily.   potassium chloride SA 20 MEQ tablet Commonly known as:  K-DUR,KLOR-CON Take 2 tablets (40 mEq total) by mouth 2 (two) times daily.   simethicone 80 MG chewable tablet Commonly known as:  MYLICON Chew 2 tablets (160 mg total) by mouth 4 (four) times daily.   thiamine 100 MG tablet Take 1 tablet (100 mg total) by mouth daily.        Discharge Condition:  Stable  Discharge Instructions Get Medicines reviewed and adjusted: Please take all your medications with you for your next visit with your Primary MD  Please request your Primary MD to go over all hospital tests and procedure/radiological results at the follow up, please ask your Primary MD to get all Hospital records sent to his/her office.  If you experience worsening of your admission symptoms, develop shortness of breath, life threatening emergency, suicidal or homicidal thoughts you must seek medical attention immediately by calling 911 or calling your MD immediately if symptoms less severe.  You must read complete instructions/literature along with all the possible adverse reactions/side effects for all the Medicines you take and that have been prescribed to you. Take any new Medicines after you have completely understood and accpet all the possible adverse reactions/side effects.   Do not drive when taking Pain medications.  Do not take more than prescribed Pain, Sleep and Anxiety Medications  Special Instructions: If you have smoked or chewed Tobacco in the last 2 yrs please stop smoking, stop any regular Alcohol and or any Recreational drug use.  Wear Seat belts while driving.  Please note  You were cared for by a hospitalist during your hospital stay. Once you are discharged, your primary care physician will handle any further medical issues. Please note that NO REFILLS  for any discharge medications will be authorized once you are discharged, as it is imperative that you return to your primary care physician (or establish a relationship with a primary care physician if you do not have one) for your aftercare needs so that they can reassess your need for medications and monitor your lab values.     No Known Allergies    Disposition:   Home  Consults:      Significant Diagnostic Studies:  Dg Chest 2 View  Result Date: 07/04/2017 CLINICAL DATA:  Pleural effusion. Nausea and chest pain. Pancreatitis. EXAM: CHEST - 2 VIEW COMPARISON:  06/29/2016 FINDINGS: The cardiac silhouette is scratched of the cardiomediastinal silhouette is within normal limits allowing for partial obscuration of the left heart border. A small to moderate-sized left pleural effusion is similar to the prior study with associated left basilar atelectasis. Right basilar lung opacities on the prior study have nearly completely resolved with minimal residual opacity possibly reflecting atelectasis. No pneumothorax is identified. There is no sizable right pleural effusion. No acute osseous abnormality is seen. IMPRESSION: 1. Unchanged small to moderate left pleural effusion with associated atelectasis. 2. Improved right basilar aeration. Electronically Signed   By: Logan Bores M.D.   On: 07/04/2017 11:03   US Abdomen Complete  Result Date: 06/27/2017 CLINICAL DATA:  Abdominal distension.  Acute pancreatitis. EXAM: ABDOMEN ULTRASOUND COMPLETE COMPARISON:  CT 06/25/2017 FINDINGS: Gallbladder: No gallstones or wall thickening visualized. No sonographic Murphy sign noted by sonographer. Common bile duct: Diameter: 3 mm common normal Liver: No focal lesion identified. Within normal limits in parenchymal echogenicity. Portal vein is patent on color Doppler imaging with normal direction of blood flow towards the liver. IVC: No abnormality visualized. Pancreas: Increased thickness of the tail consistent  with acute inflammation. Spleen: Size and appearance within normal limits. Right Kidney: Length: 10.3 cm. Echogenicity within normal limits. No mass or hydronephrosis visualized. Left Kidney: Length: 11.5 cm.  1.5 cm cyst in the midportion. Abdominal aorta: Maximal diameter 2.6 cm. Other findings: Bilateral pleural effusions. Some intraperitoneal fluid adjacent to the pancreatic tail and spleen. Scattered other areas of intraperitoneal fluid. Some fluid-filled bowel which could be secondary to ileus. IMPRESSION: Evidence of pancreatitis of the tail the pancreas with surrounding regional retroperitoneal and intraperitoneal fluid. Scattered intraperitoneal fluid elsewhere, freely distributed. No evidence of gallstones. Fluid-filled small bowel which could be secondary to ileus. Electronically Signed   By: Nelson Chimes M.D.   On: 06/27/2017 16:46   Ct Abdomen Pelvis W Contrast  Result Date: 07/02/2017 CLINICAL DATA:  Followup pancreatitis and small bowel obstruction. EXAM: CT ABDOMEN AND PELVIS WITH CONTRAST TECHNIQUE: Multidetector CT imaging of the abdomen and pelvis was performed using the standard protocol following bolus administration of intravenous contrast. CONTRAST:  128m ISOVUE-300 IOPAMIDOL (ISOVUE-300) INJECTION 61% COMPARISON:  06/25/2017 FINDINGS: Lower Chest: Increased bilateral pleural effusions and bibasilar atelectasis, left side greater than right. Hepatobiliary: No hepatic masses identified. Gallbladder is unremarkable. No evidence of biliary ductal dilatation. Pancreas: The pancreatic tail remains swollen with near  complete absence of contrast enhancement, consistent with pancreatic necrosis. Increased multilocular fluid collections are seen within the left upper and lower quadrants with extension along the left pelvic sidewall. Increased mild free fluid also seen in the right upper quadrant and pelvis. Spleen: Within normal limits in size and appearance. Adrenals/Urinary Tract: No masses  identified. Stable small left renal cyst. No evidence of hydronephrosis. Unremarkable unopacified urinary bladder. Stomach/Bowel: Dilated contrast filled small bowel loops are seen in the lower abdomen and pelvis, with transition point in the anterior right lower quadrant, suspicious for partial small bowel obstruction. Vascular/Lymphatic: No pathologically enlarged lymph nodes. No abdominal aortic aneurysm. Aortic atherosclerosis. Reproductive:  No mass or other significant abnormality. Other:  None. Musculoskeletal:  No suspicious bone lesions identified. IMPRESSION: Pancreatic tail necrosis, with increased multilocular fluid collections in the left abdomen and pelvis, consistent with developing pseudocysts. Increase small amount of intraperitoneal fluid also noted. Dilated small bowel loops with transition point in anterior right lower quadrant, suspicious for small bowel obstruction. Increased bilateral pleural effusions and bibasilar atelectasis. Electronically Signed   By: Earle Gell M.D.   On: 07/02/2017 18:28   Ct Abdomen Pelvis W Contrast  Result Date: 06/25/2017 CLINICAL DATA:  Acute pancreatitis. EXAM: CT ABDOMEN AND PELVIS WITH CONTRAST TECHNIQUE: Multidetector CT imaging of the abdomen and pelvis was performed using the standard protocol following bolus administration of intravenous contrast. CONTRAST:  15m ISOVUE-300 IOPAMIDOL (ISOVUE-300) INJECTION 61% COMPARISON:  None. FINDINGS: Lower chest: No acute abnormality. Hepatobiliary: No focal liver abnormality is seen. No gallstones, gallbladder wall thickening, or biliary dilatation. Pancreas: Severe peripancreatic inflammatory changes around the pancreatic tail and distal body with a large amount of fluid around the distal body and pancreatic tail extending into the left pericolic gutter. Areas of hypoenhancement in the distal body of the pancreas concerning for pancreatic necrosis. Spleen: Normal in size without focal abnormality.  Adrenals/Urinary Tract: Adrenal glands are unremarkable. Kidneys are normal, without renal calculi, focal lesion, or hydronephrosis. Bladder is unremarkable. Stomach/Bowel: Stomach is within normal limits. No evidence of bowel wall thickening, distention, or inflammatory changes. Vascular/Lymphatic: Normal caliber abdominal aorta with mild atherosclerosis. No lymphadenopathy. Reproductive: Prostate is unremarkable. Other: Small amount of pelvic free fluid. No abdominal hernia or inguinal hernia. Musculoskeletal: No acute or significant osseous findings. IMPRESSION: 1. Severe pancreatitis of the distal body and pancreatic tail with a large amount of fluid around the distal pancreas. Areas of hypoenhancement in the distal body of the pancreas concerning for pancreatic necrosis. Electronically Signed   By: HKathreen Devoid  On: 06/25/2017 12:29   UKoreaAbdomen Limited  Result Date: 06/25/2017 CLINICAL DATA:  47year old male with abdominal pain and vomiting 4 times in the past 24 hours. Elevated lipase. EXAM: ULTRASOUND ABDOMEN LIMITED RIGHT UPPER QUADRANT COMPARISON:  None. FINDINGS: Gallbladder: No gallstones or wall thickening visualized. No sonographic Murphy sign noted by sonographer. Common bile duct: Diameter: 2.6 mm Liver: No focal lesion identified. Within normal limits in parenchymal echogenicity. Portal vein is patent on color Doppler imaging with normal direction of blood flow towards the liver. IMPRESSION: Negative right upper quadrant ultrasound. Please note that pancreas was not assessed and if primary pancreatic abnormality is of concern as cause of elevated lipase, CT imaging may then be considered. Electronically Signed   By: SGenia DelM.D.   On: 06/25/2017 09:30   Dg Abd 2 Views  Result Date: 07/02/2017 CLINICAL DATA:  Mid abdominal pain and swelling. Ileus. Pancreatitis. EXAM: ABDOMEN - 2 VIEW COMPARISON:  Radiographs dated 07/01/2017, 06/30/2017 and 06/29/2017 FINDINGS: NG tube has been  removed. There is persistent dilatation of small bowel loops. Stomach and colon are not distended. Increased moderate left pleural effusion with consolidation in the left lower lobe. Atelectasis at the right lung base. IMPRESSION: 1. Persistent ileus. 2. Increased moderate left effusion. 3. Persistent consolidation in the left lower lobe. 4. Slight atelectasis at the right lung base, unchanged. Electronically Signed   By: Lorriane Shire M.D.   On: 07/02/2017 09:11   Dg Abd 2 Views  Result Date: 07/01/2017 CLINICAL DATA:  Small bowel obstruction. EXAM: ABDOMEN - 2 VIEW COMPARISON:  Plain films of the abdomen 06/29/2017 and 06/30/2017. CT abdomen and pelvis 06/25/2017. FINDINGS: NG tube remains in place. Side port is just within the stomach. The tube could be advanced 2-3 cm. Gaseous distention of small bowel with loops measuring up to 5.5 cm and air-fluid levels persist. There is little to no gas in the colon. Left pleural effusion and basilar airspace disease are noted. IMPRESSION: No change in small bowel obstruction.  Airspace disease Left pleural effusion and basilar. Electronically Signed   By: Inge Rise M.D.   On: 07/01/2017 11:00   Dg Abd 2 Views  Result Date: 06/30/2017 CLINICAL DATA:  Encounter for small bowel obstruction EXAM: ABDOMEN - 2 VIEW COMPARISON:  the previous day's study FINDINGS: Consolidation/atelectasis in the visualized left lower lung with probable effusion. No free air. Nasogastric tube extends into the decompressed stomach. Multiple dilated small bowel loops in the mid abdomen with fluid levels on the erect radiograph. The colon is decompressed. Regional bones unremarkable. IMPRESSION: 1. Persistent small bowel obstruction, nasogastric tube in the stomach. 2. No free air. Electronically Signed   By: Lucrezia Europe M.D.   On: 06/30/2017 09:48   Dg Abd 2 Views  Result Date: 06/28/2017 CLINICAL DATA:  Abdominal distension, pain and swelling. EXAM: ABDOMEN - 2 VIEW COMPARISON:   None. FINDINGS: Diffusely dilated gas-filled small bowel loops throughout the abdomen, with associated air-fluid levels, consistent with small-bowel obstruction. Only a small amount of questionable colonic gas is seen in the LEFT abdomen, suggesting complete versus high-grade partial small bowel obstruction. No evidence of free intraperitoneal air seen. No evidence of renal or ureteral calculi. Osseous structures are unremarkable. IMPRESSION: Small-bowel obstruction, with significantly dilated gas-filled loops of small bowel throughout the abdomen, with associated air-fluid levels. This is most compatible with complete versus high-grade partial small bowel obstruction, less likely ileus. These results will be called to the ordering clinician or representative by the Radiologist Assistant, and communication documented in the PACS or zVision Dashboard. Electronically Signed   By: Franki Cabot M.D.   On: 06/28/2017 20:40   Dg Abd Acute W/chest  Result Date: 06/29/2017 CLINICAL DATA:  Small-bowel obstruction. EXAM: DG ABDOMEN ACUTE W/ 1V CHEST COMPARISON:  06/28/2017. FINDINGS: Chest x-ray reveals bibasilar pulmonary infiltrates and left-sided pleural effusion. Persistent dilated loops of small bowel are again noted consistent small bowel obstruction. There is a paucity of gas in the colon. No free air identified. IMPRESSION: 1. Persistent dilated loops of small bowel consistent with small bowel obstruction. No significant interim improvement. 2. Chest x-ray reveals bibasilar pulmonary infiltrates. Aspiration cannot be excluded. Left pleural effusion is also noted. Electronically Signed   By: Marcello Moores  Register   On: 06/29/2017 10:00   Dg Abd Portable 1v  Result Date: 06/29/2017 CLINICAL DATA:  Initial evaluation for NG tube placement. EXAM: PORTABLE ABDOMEN - 1 VIEW COMPARISON:  Prior radiograph  from earlier the same day. FINDINGS: Enteric tube in place with tip overlying the proximal stomach, well beyond the GE  junction. Side hole overlies the gastric cardia, just beyond the GE junction as well. Persistent dilated loops of gas-filled small bowel throughout the abdomen, consistent with obstruction. IMPRESSION: 1. Tip and side hole of enteric tube overlying the proximal stomach, beyond the GE junction. 2. Ongoing small bowel obstruction. Electronically Signed   By: Jeannine Boga M.D.   On: 06/29/2017 23:00   Dg Abd Portable 1v  Result Date: 06/29/2017 CLINICAL DATA:  NG tube placement. EXAM: PORTABLE ABDOMEN - 1 VIEW COMPARISON:  06/29/2017. FINDINGS: NG tube noted with tip below left hemidiaphragm. Distended loops of small bowel are noted consistent with small bowel obstruction. Bibasilar atelectasis/infiltrates. Aspiration cannot be excluded. Left-sided pleural effusion. IMPRESSION: 1. Interim placement of NG tube. Tip below left hemidiaphragm. Persistent distended loops of small bowel consistent small bowel obstruction. 2. Bibasilar atelectasis and infiltrates. Aspiration cannot be excluded. Left-sided pleural effusion. Electronically Signed   By: Marcello Moores  Register   On: 06/29/2017 10:52        Filed Weights   07/01/17 2050 07/02/17 2014 07/03/17 2019  Weight: 75.7 kg (166 lb 14.2 oz) 72.7 kg (160 lb 3.2 oz) 72.7 kg (160 lb 3 oz)     Microbiology: Recent Results (from the past 240 hour(s))  Culture, blood (Routine X 2) w Reflex to ID Panel     Status: None   Collection Time: 06/29/17  9:01 AM  Result Value Ref Range Status   Specimen Description BLOOD LEFT ANTECUBITAL  Final   Special Requests   Final    BOTTLES DRAWN AEROBIC AND ANAEROBIC Blood Culture adequate volume   Culture   Final    NO GROWTH 5 DAYS Performed at [facility], [address] [place], [place] [address]    Report Status 07/04/2017 FINAL  Final  Culture, blood (Routine X 2) w Reflex to ID Panel     Status: None   Collection Time: 06/29/17  9:06 AM  Result Value Ref Range Status   Specimen Description BLOOD  LEFT HAND  Final   Special Requests   Final    BOTTLES DRAWN AEROBIC ONLY Blood Culture adequate volume   Culture   Final    NO GROWTH 5 DAYS Performed at [facility], [place] [address], [place], [place] [address]    Report Status 07/04/2017 FINAL  Final  Urine Culture     Status: None   Collection Time: 06/29/17 11:28 AM  Result Value Ref Range Status   Specimen Description URINE, RANDOM  Final   Special Requests NONE  Final   Culture   Final    NO GROWTH Performed at [facility], [place] [address], [place], [place] [address]    Report Status 06/30/2017 FINAL  Final       Blood Culture    Component Value Date/Time   SDES URINE, RANDOM 06/29/2017 1128   SPECREQUEST NONE 06/29/2017 1128   CULT  06/29/2017 1128    NO GROWTH Performed at [facility], [place] [address], [place], [place] [address]    REPTSTATUS 06/30/2017 FINAL 06/29/2017 1128      Labs: Results for orders placed or performed during the hospital encounter of 06/25/17 (from the past 48 hour(s))  Hemoglobin and hematocrit, blood     Status: Abnormal   Collection Time: 07/03/17  1:50 PM  Result Value Ref Range  Hemoglobin 7.9 (L) 13.0 - 17.0 g/dL   HCT 22.5 (L) 39.0 - 52.0 %    Comment: Performed at [facility], [place] [address], [place], [place] [address]  CBC     Status: Abnormal   Collection Time: 07/04/17  4:32 AM  Result Value Ref Range   WBC 15.6 (H) 4.0 - 10.5 K/uL   RBC 2.34 (L) 4.22 - 5.81 MIL/uL   Hemoglobin 7.4 (L) 13.0 - 17.0 g/dL   HCT 21.3 (L) 39.0 - 52.0 %   MCV 91.0 78.0 - 100.0 fL   MCH 31.6 26.0 - 34.0 pg   MCHC 34.7 30.0 - 36.0 g/dL   RDW 14.0 11.5 - 15.5 %   Platelets 453 (H) 150 - 400 K/uL    Comment: Performed at [facility], [place] [address], [place], [place] [address]  Comprehensive metabolic panel     Status: Abnormal   Collection Time: 07/04/17  4:32 AM  Result Value Ref Range   Sodium 139 135 - 145 mmol/L   Potassium 3.2 (L) 3.5 - 5.1 mmol/L    Chloride 102 101 - 111 mmol/L   CO2 25 22 - 32 mmol/L   Glucose, Bld 103 (H) 65 - 99 mg/dL   BUN 9 6 - 20 mg/dL   Creatinine, Ser 0.89 0.61 - 1.24 mg/dL   Calcium 8.2 (L) 8.9 - 10.3 mg/dL   Total Protein 6.3 (L) 6.5 - 8.1 g/dL   Albumin 2.0 (L) 3.5 - 5.0 g/dL   AST 137 (H) 15 - 41 U/L   ALT 80 (H) 17 - 63 U/L   Alkaline Phosphatase 98 38 - 126 U/L   Total Bilirubin 1.3 (H) 0.3 - 1.2 mg/dL   GFR calc non Af Amer >60 >60 mL/min   GFR calc Af Amer >60 >60 mL/min    Comment: (NOTE) The eGFR has been calculated using the CKD EPI equation. This calculation has not been validated in all clinical situations. eGFR's persistently <60 mL/min signify possible Chronic Kidney Disease.    Anion gap 12 5 - 15    Comment: Performed at [facility] [address], [place], [place] [address]  Magnesium     Status: None   Collection Time: 07/04/17  4:32 AM  Result Value Ref Range   Magnesium 1.9 1.7 - 2.4 mg/dL    Comment: Performed at [facility] [address], [place], [place] [address]  CBC with Differential/Platelet     Status: Abnormal   Collection Time: 07/05/17  4:40 AM  Result Value Ref Range   WBC 18.2 (H) 4.0 - 10.5 K/uL   RBC 2.40 (L) 4.22 - 5.81 MIL/uL   Hemoglobin 7.6 (L) 13.0 - 17.0 g/dL   HCT 22.0 (L) 39.0 - 52.0 %   MCV 91.7 78.0 - 100.0 fL   MCH 31.7 26.0 - 34.0 pg   MCHC 34.5 30.0 - 36.0 g/dL   RDW 14.2 11.5 - 15.5 %   Platelets 507 (H) 150 - 400 K/uL   Neutrophils Relative % 68 %   Lymphocytes Relative 17 %   Monocytes Relative 13 %   Eosinophils Relative 1 %   Basophils Relative 1 %   Neutro Abs 12.3 (H) 1.7 - 7.7 K/uL   Lymphs Abs 3.1 0.7 - 4.0 K/uL   Monocytes Absolute 2.4 (H) 0.1 - 1.0 K/uL   Eosinophils Absolute 0.2 0.0 - 0.7 K/uL   Basophils Absolute 0.2 (H) 0.0 - 0.1  K/uL   RBC Morphology TARGET CELLS     Comment: RARE NRBCs   WBC Morphology DOHLE BODIES     Comment: Performed at [facility], [place] [address], [place], [place] [address]   Comprehensive metabolic panel     Status: Abnormal   Collection Time: 07/05/17  4:40 AM  Result Value Ref Range   Sodium 137 135 - 145 mmol/L   Potassium 3.8 3.5 - 5.1 mmol/L   Chloride 106 101 - 111 mmol/L   CO2 22 22 - 32 mmol/L   Glucose, Bld 106 (H) 65 - 99 mg/dL   BUN 8 6 - 20 mg/dL   Creatinine, Ser 0.86 0.61 - 1.24 mg/dL   Calcium 8.5 (L) 8.9 - 10.3 mg/dL   Total Protein 6.8 6.5 - 8.1 g/dL   Albumin 2.1 (L) 3.5 - 5.0 g/dL   AST 123 (H) 15 - 41 U/L   ALT 78 (H) 17 - 63 U/L   Alkaline Phosphatase 112 38 - 126 U/L   Total Bilirubin 1.4 (H) 0.3 - 1.2 mg/dL   GFR calc non Af Amer >60 >60 mL/min   GFR calc Af Amer >60 >60 mL/min    Comment: (NOTE) The eGFR has been calculated using the CKD EPI equation. This calculation has not been validated in all clinical situations. eGFR's persistently <60 mL/min signify possible Chronic Kidney Disease.    Anion gap 9 5 - 15    Comment: Performed at [facility] [address] [place], [place] [address]     Lipid Panel     Component Value Date/Time   TRIG 233 (H) 06/25/2017 0512     Lab Results  Component Value Date   HGBA1C 4.8 06/25/2017     Lab Results  Component Value Date   CREATININE 0.86 07/05/2017     HPI :  Ivan Harper a 47 y.o.malewithno significant pastmedical historypresenting with abdominal pain, found to have acute pancreatitis, initial lipase greater than 1000. CT abdomen pelvis concerning for pancreatic tail necrosis with multilocular fluid collections in the left abdomen and the pelvis consistent with developing pseudocyst  Patient improved clinically in terms of his pancreatitis however continue to have distended abdomen and abdominal tightness.  Abdominal films obtained consistent with a high-grade small bowel obstruction.  Patient made n.p.o .  NG tube placed ,.  General surgery consulted. Subsequent resolution of his ileus which is thought to be secondary to EtOH pancreatitis. PO  intake  established patient discharged. Detailed hospital course is below     HOSPITAL COURSE    #1  Ileus secondary to acute pancreatitis versus small bowel obstruction Patient with ileus likely complication from his acute pancreatitis.  Patient states had his appendix taken out in approximately 2008   Concern for complication of pancreatitis lipase levels trending back up.  LFTs trending back up however seem to be plateauing.   patient found to have persistent      leukocytosis and low-grade fever but clinically improving .  treated with Zosyn and subsequently changed to Unasyn for necrotizing pancreatitis. Now switched to Augmentin for another 7 days. Completed IV fluids   .   Patient states having regular bowel movements now  .  Diet has been advanced to soft which he is tolerating.  General surgery following and appreciate input and recommendations, they have signed off as the patient is stable.   2 acute pancreatitis with necrosis, developing pseudocyst  Likely secondary to alcohol use.  Patient  presented abdominal pain nausea and emesis.  Lipase levels elevated at 1230 with a AST of 133 and ALT of 56 on presentation.  CT abdomen and pelvis done with severe pancreatitis of the distal body and pancreatic tail with large amount of fluid around the distal pancreas.  Areas of hypo-enhancement in the distal body of the pancreas concerning for pancreatic necrosis.  CT abdomen and pelvis negative for gallstones or thickened gallbladder wall.  Triglycerides of 233.    Patient subsequently   developed   ileus versus high-grade small bowel obstruction.  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  Patient improving clinically,  advanced  full liquid diet   to soft diet  which he  is tolerating.    3. Low-grade fever could be secondary to pancreatic necrosis? Switch from Unasyn to Augmentin 7 days Urine culture negative this admission Blood culture no growth since 4/19  4.  Alcohol abuse Alcohol cessation was stressed to patient.  Due to abdominal distention abdominal ultrasound was obtained which was negative for ascites however consistent with pancreatitis, fluid filled small bowel, scattered intraperitoneal fluid, no evidence of gallstones.  No evidence of cirrhosis.  Patient with no signs of withdrawal.  Continue ativan withdrawal protocol.  Follow.  5.  Hypokalemia Likely secondary to diuretics which were given.   repleted, patient to continue with potassium by mouth and have it rechecked in 3-5 days  6.  Hyperlycemia Likely reactive.  Hemoglobin A1c of 4.8.    7.  penile swelling Question onset.  Likely secondary to edema in the setting of low albumin.  Patient is circumcised.  Denies any pain.  No discharge.  Patient is currently afebrile.  Able to urinate without any problems.  No dysuria.  Swelling improved. Follow.    8.  Abdominal distention Secondary to ileus versus small bowel obstruction.   resolved  9.  Low-grade fever secondary to HCAP versus aspiration pneumonia Patient noted to have a low-grade temperature of 100.8   on and off since 06/29/2017.    Blood cultures with no growth to date.  Patient with a cough.    films reviewed and concerning for left basilar infiltrate and moderate effusion.  Patient also noted to have a leukocytosis with a white count of 15.7 from 13.9 from 14.4.  Sputum Gram stain and cultures pending.  Urine Legionella antigen negative.  Urine pneumococcus antigen negative.   . Repeat abdominal films concerning for airspace disease.  Continue Mucinex, scheduled nebs.  Change IV Zosyn to IV Unasyn and  transition to oral Augmentin  today for another 7 days.  CT abdomen and pelvis showing formation of pseudocyst.   Patient however improving clinically and appears nontoxic and   stable.    10.  Sinus tachycardia Likely secondary to problems #1 and 2 probable pneumonia/low-grade fever.  .  Repeat  CT abdomen and pelvis with pancreatic tail necrosis, increased multiloculated fluid collections in the left abdomen and pelvis consistent with developing pseudocyst.  Increase small amount of intraperitoneal fluid is also noted.  Dilated small bowel loops with transition point anterior right lower quadrant suspicious for small bowel obstruction.  Increased bilateral pleural effusions and bibasilar atelectasis. Repeat chest x-ray. 4/24. Unchanged small to moderate left pleural effusion with associated atelectasis..   Low suspicion for PE, has been  on Lovenox for DVT prophylaxis    11.  Anemia Patient denies any overt GI bleed.   Anemia panel shows high ferritin likely acute phase reactant, Ion is 20.  Hemoglobin trending down, follow CBC closely.  Transfusion threshold hemoglobin less than 7.        Blood pressure 133/84, pulse (!) 112, temperature (!) 100.4 F (38 C), temperature source Oral, resp. rate 18, height '5\' 9"'$  (1.753 m), weight 72.7 kg (160 lb 3 oz), SpO2 99 %.  Gen:  Alert, NAD, pleasant Card: sinus tachycardia, pedal pulses 2+ BL Pulm:  Normal effort, clear to auscultation bilaterally Abd: Soft, non-tender, non-distended, bowel sounds present, no HSM Skin: warm and dry, no rashes  Psych: A&Ox3     Consultants:   General surgery: Dr. Georganna Skeans 06/29/2017  Procedures:  CT abdomen and pelvis 06/25/2017  Abdominal ultrasound 06/27/2017  RU Quadrant ultrasound 06/25/2017  Abdominal x-rays 06/28/2017, 06/29/2017, 06/30/2017, 07/01/2017  CT abdomen and pelvis 07/02/2017  Antimicrobials:  IV Zosyn 07/01/2017>>>> 07/03/2017  IV Unasyn 07/03/2017>>>>> 07/04/2017  Augmentin 07/04/2017     Follow-up Information    [facility].   Why:   You can call schedule new patient appointment to establish PCP without insurance. Contact information: [place] [id number] [phone]          Signed: Reyne Dumas 07/05/2017, 10:04 AM        Time spent >1 hour

## 2017-07-10 NOTE — Progress Notes (Signed)
Patient,with his wife arrived at the [facility] nurse station 07/08/17 at 1130 a.m.He approached this Clinical research associate, since he recognized me as the one who discharged him.He is blaming the staffs and the doctor for not giving him what kind of food he needs to eat,another complaint was for not giving him a medical note for his work on the day of discharged.I remembered that I went on his discharged papers on each paged and explained to him stay away from fatty foods.I asked him ,if he had questions hours prior to his discharged. And he said ''none''.Now he asked especially the medical note for his work.Patient was assertive ,upset blaming staffs for coming back here.His wife fully understand what went wrong intervene on a very nice way and asked her husband to stay from the station.Another nurse talked to the patient.This Clinical research associate called the [facility] admission for help for their need.Medical office number given to them ,and told them to come at [phone] to get their need.

## 2020-03-03 ENCOUNTER — Other Ambulatory Visit: Payer: Self-pay

## 2020-03-03 ENCOUNTER — Ambulatory Visit (HOSPITAL_COMMUNITY)
Admission: EM | Admit: 2020-03-03 | Discharge: 2020-03-03 | Disposition: A | Discharge status: Home / Self Care | Payer: HRSA Program | Attending: Family Medicine | Admitting: Family Medicine

## 2020-03-03 ENCOUNTER — Encounter (HOSPITAL_COMMUNITY): Payer: Self-pay | Admitting: Emergency Medicine

## 2020-03-03 DIAGNOSIS — R059 Cough, unspecified: Secondary | ICD-10-CM | POA: Insufficient documentation

## 2020-03-03 DIAGNOSIS — R0602 Shortness of breath: Secondary | ICD-10-CM

## 2020-03-03 DIAGNOSIS — Z20822 Contact with and (suspected) exposure to covid-19: Secondary | ICD-10-CM | POA: Insufficient documentation

## 2020-03-03 HISTORY — DX: Acute pancreatitis without necrosis or infection, unspecified: K85.90

## 2020-03-03 MED ORDER — ALBUTEROL SULFATE HFA 108 (90 BASE) MCG/ACT IN AERS: 2.0000 | INHALATION_SPRAY | Freq: Four times a day (QID) | RESPIRATORY_TRACT | 0 refills | Status: DC | PRN

## 2020-03-03 NOTE — ED Triage Notes (Signed)
Pt c/o of SOB, weakness and cough x 2 days.

## 2020-03-03 NOTE — ED Provider Notes (Signed)
[facility]    CSN: [id number] Arrival date & time: 03/03/20  1717      History   Chief Complaint Chief Complaint  Patient presents with  . Shortness of Breath  . Cough  . Fatigue    HPI Ivan Harper is a 49 y.o. male.   Presenting today with 2 day history of mild chest tightness, cough and DOE, generalized fatigue and mild weakness. Denies fever, chills, CP, abdominal pain, N/V/D, congestion, sore throat. Not trying anything for sxs thus far. Works in [employer] so lots of sick exposures, denies hx of pulmonary dz.      Past Medical History:  Diagnosis Date  . Pancreatitis     Patient Active Problem List   Diagnosis Date Noted  . Hospital acquired PNA 07/01/2017  . Ileus, unspecified (HCC) 06/30/2017  . Abdominal distension   . SBO (small bowel obstruction) (HCC) 06/29/2017  . Fever   . Hypokalemia   . Penile swelling 06/28/2017  . Acute pancreatitis 06/25/2017  . Hyperglycemia 06/25/2017  . Alcohol abuse 06/25/2017    Past Surgical History:  Procedure Laterality Date  . APPENDECTOMY         Home Medications    Prior to Admission medications   Medication Sig Start Date End Date Taking? Authorizing Provider  albuterol (VENTOLIN HFA) 108 (90 Base) MCG/ACT inhaler Inhale 2 puffs into the lungs every 6 (six) hours as needed for wheezing or shortness of breath. 03/03/20   Particia Nearing, PA-C  Amino Acids-Protein Hydrolys (FEEDING SUPPLEMENT, PRO-STAT SUGAR FREE 64,) LIQD Take 30 mLs by mouth 2 (two) times daily. 07/05/17   Richarda Overlie, MD  atorvastatin (LIPITOR) 20 MG tablet Take 1 tablet (20 mg total) by mouth daily at 6 PM. 07/05/17   Richarda Overlie, MD  dextromethorphan-guaiFENesin (MUCINEX DM) 30-600 MG 12hr tablet Take 2 tablets by mouth 2 (two) times daily. 07/05/17   Richarda Overlie, MD  famotidine (PEPCID) 20 MG tablet Take 1 tablet (20 mg total) by mouth 2 (two) times daily. 07/05/17   Richarda Overlie, MD  folic acid (FOLVITE) 1  MG tablet Take 1 tablet (1 mg total) by mouth daily. 07/05/17   Richarda Overlie, MD  HYDROcodone-homatropine (HYCODAN) 5-1.5 MG/5ML syrup Take 5 mLs by mouth every 4 (four) hours as needed for cough. 07/05/17   Richarda Overlie, MD  Multiple Vitamin (MULTIVITAMIN WITH MINERALS) TABS tablet Take 1 tablet by mouth daily. 07/05/17   Richarda Overlie, MD  potassium chloride SA (K-DUR,KLOR-CON) 20 MEQ tablet Take 2 tablets (40 mEq total) by mouth 2 (two) times daily. 07/05/17   Richarda Overlie, MD  simethicone (MYLICON) 80 MG chewable tablet Chew 2 tablets (160 mg total) by mouth 4 (four) times daily. 07/05/17   Richarda Overlie, MD  thiamine 100 MG tablet Take 1 tablet (100 mg total) by mouth daily. 07/05/17   Richarda Overlie, MD    Family History Family History  Problem Relation Age of Onset  . Pancreatic disease Neg Hx     Social History Social History   Tobacco Use  . Smoking status: Former Games developer  . Smokeless tobacco: Never Used  . Tobacco comment: stopped 30 years ago  Substance Use Topics  . Alcohol use: Not Currently    Comment: stopped drinking alcohol in 2019  . Drug use: No     Allergies   Patient has no known allergies.   Review of Systems Review of Systems PER HPI   Physical Exam Triage Vital Signs ED  Triage Vitals  Enc Vitals Group     BP 03/03/20 1812 (!) 150/89     Pulse Rate 03/03/20 1727 85     Resp 03/03/20 1727 18     Temp 03/03/20 1812 97.9 F (36.6 C)     Temp Source 03/03/20 1812 Oral     SpO2 03/03/20 1727 100 %     Weight --      Height --      Head Circumference --      Peak Flow --      Pain Score 03/03/20 1815 2     Pain Loc --      Pain Edu? --      Excl. in GC? --    No data found.  Updated Vital Signs BP (!) 150/89 (BP Location: Right Arm)   Pulse 83   Temp 97.9 F (36.6 C) (Oral)   Resp 20   SpO2 99%   Visual Acuity Right Eye Distance:   Left Eye Distance:   Bilateral Distance:    Right Eye Near:   Left Eye Near:    Bilateral Near:      Physical Exam Vitals and nursing note reviewed.  Constitutional:      Appearance: Normal appearance.  HENT:     Head: Atraumatic.     Right Ear: Tympanic membrane normal.     Left Ear: Tympanic membrane normal.     Nose: Nose normal.     Mouth/Throat:     Mouth: Mucous membranes are moist.     Pharynx: Oropharynx is clear. No oropharyngeal exudate.  Eyes:     Extraocular Movements: Extraocular movements intact.     Conjunctiva/sclera: Conjunctivae normal.  Cardiovascular:     Rate and Rhythm: Normal rate and regular rhythm.  Pulmonary:     Effort: Pulmonary effort is normal.     Breath sounds: Normal breath sounds. No wheezing or rales.  Abdominal:     General: Bowel sounds are normal.     Palpations: Abdomen is soft.  Musculoskeletal:        General: Normal range of motion.     Cervical back: Normal range of motion and neck supple.  Skin:    General: Skin is warm and dry.  Neurological:     General: No focal deficit present.     Mental Status: He is oriented to person, place, and time.  Psychiatric:        Mood and Affect: Mood normal.        Thought Content: Thought content normal.        Judgment: Judgment normal.      UC Treatments / Results  Labs (all labs ordered are listed, but only abnormal results are displayed) Labs Reviewed  SARS CORONAVIRUS 2 (TAT 6-24 HRS)    EKG   Radiology No results found.  Procedures Procedures (including critical care time)  Medications Ordered in UC Medications - No data to display  Initial Impression / Assessment and Plan / UC Course  I have reviewed the triage vital signs and the nursing notes.  Pertinent labs & imaging results that were available during my care of the patient were reviewed by me and considered in my medical decision making (see chart for details).     Well appearing, 99% O2 saturation, normal HR and temperature, exam completely benign. COVID pcr pending, albuterol inhaler sent for prn use for  chest tightness. Discussed return precautions and OTC symptomatic medications and home care.  WOrk note  given.   Final Clinical Impressions(s) / UC Diagnoses   Final diagnoses:  Cough  Shortness of breath   Discharge Instructions   None    ED Prescriptions    Medication Sig Dispense Auth. Provider   albuterol (VENTOLIN HFA) 108 (90 Base) MCG/ACT inhaler Inhale 2 puffs into the lungs every 6 (six) hours as needed for wheezing or shortness of breath. 1 each Particia Nearing, PA-C     PDMP not reviewed this encounter.   Particia Nearing, [place] 03/03/20 1911

## 2020-03-03 NOTE — ED Notes (Signed)
Vital signs checked at waiting area.

## 2020-03-04 LAB — SARS CORONAVIRUS 2 (TAT 6-24 HRS): SARS Coronavirus 2: NEGATIVE

## 2020-06-25 ENCOUNTER — Other Ambulatory Visit: Payer: Self-pay

## 2020-06-25 ENCOUNTER — Encounter (HOSPITAL_COMMUNITY): Payer: Self-pay

## 2020-06-25 ENCOUNTER — Ambulatory Visit (HOSPITAL_COMMUNITY)
Admission: EM | Admit: 2020-06-25 | Discharge: 2020-06-25 | Disposition: A | Discharge status: Home / Self Care | Payer: Self-pay | Attending: Emergency Medicine | Admitting: Emergency Medicine

## 2020-06-25 DIAGNOSIS — R1013 Epigastric pain: Secondary | ICD-10-CM | POA: Insufficient documentation

## 2020-06-25 DIAGNOSIS — R197 Diarrhea, unspecified: Secondary | ICD-10-CM | POA: Insufficient documentation

## 2020-06-25 LAB — COMPREHENSIVE METABOLIC PANEL
ALT: 14 U/L (ref 0–44)
AST: 16 U/L (ref 15–41)
Albumin: 4 g/dL (ref 3.5–5.0)
Alkaline Phosphatase: 45 U/L (ref 38–126)
Anion gap: 4 — ABNORMAL LOW (ref 5–15)
BUN: 8 mg/dL (ref 6–20)
CO2: 30 mmol/L (ref 22–32)
Calcium: 9 mg/dL (ref 8.9–10.3)
Chloride: 105 mmol/L (ref 98–111)
Creatinine, Ser: 0.8 mg/dL (ref 0.61–1.24)
GFR, Estimated: >60 mL/min (ref >60)
Glucose, Bld: 97 mg/dL (ref 70–99)
Potassium: 3.9 mmol/L (ref 3.5–5.1)
Sodium: 139 mmol/L (ref 135–145)
Total Bilirubin: 0.4 mg/dL (ref 0.3–1.2)
Total Protein: 6.9 g/dL (ref 6.5–8.1)

## 2020-06-25 LAB — CBC WITH DIFFERENTIAL/PLATELET
Abs Immature Granulocytes: 0.01 10*3/uL (ref 0.00–0.07)
Basophils Absolute: 0.1 10*3/uL (ref 0.0–0.1)
Basophils Relative: 1 %
Eosinophils Absolute: 0.3 10*3/uL (ref 0.0–0.5)
Eosinophils Relative: 4 %
HCT: 37.4 % — ABNORMAL LOW (ref 39.0–52.0)
Hemoglobin: 12.8 g/dL — ABNORMAL LOW (ref 13.0–17.0)
Immature Granulocytes: 0 %
Lymphocytes Relative: 54 %
Lymphs Abs: 3.5 10*3/uL (ref 0.7–4.0)
MCH: 32.2 pg (ref 26.0–34.0)
MCHC: 34.2 g/dL (ref 30.0–36.0)
MCV: 94.2 fL (ref 80.0–100.0)
Monocytes Absolute: 0.5 10*3/uL (ref 0.1–1.0)
Monocytes Relative: 8 %
Neutro Abs: 2.1 10*3/uL (ref 1.7–7.7)
Neutrophils Relative %: 33 %
Platelets: 223 10*3/uL (ref 150–400)
RBC: 3.97 MIL/uL — ABNORMAL LOW (ref 4.22–5.81)
RDW: 12.2 % (ref 11.5–15.5)
WBC: 6.4 10*3/uL (ref 4.0–10.5)
nRBC: 0 % (ref 0.0–0.2)

## 2020-06-25 NOTE — ED Provider Notes (Signed)
[facility]    CSN: [id number] Arrival date & time: 06/25/20  0815      History   Chief Complaint Chief Complaint  Patient presents with  . Abdominal Pain  . Diarrhea    HPI Ivan Harper is a 50 y.o. male.   Patient here for evaluation of generalized abdominal pain and diarrhea that started last p.m.  Reports history of pancreatitis with similar symptoms.  Denies any recent alcohol or drug use.  Has not taken any OTC medications or treatments. Denies any specific alleviating or aggravating factors.  Denies any fevers, chest pain, shortness of breath, N/V/D, numbness, tingling, weakness, or headaches.   ROS: As per HPI, all other pertinent ROS negative  The history is provided by the patient.    Past Medical History:  Diagnosis Date  . Pancreatitis     Patient Active Problem List   Diagnosis Date Noted  . Hospital acquired PNA 07/01/2017  . Ileus, unspecified (HCC) 06/30/2017  . Abdominal distension   . SBO (small bowel obstruction) (HCC) 06/29/2017  . Fever   . Hypokalemia   . Penile swelling 06/28/2017  . Acute pancreatitis 06/25/2017  . Hyperglycemia 06/25/2017  . Alcohol abuse 06/25/2017    Past Surgical History:  Procedure Laterality Date  . APPENDECTOMY         Home Medications    Prior to Admission medications   Medication Sig Start Date End Date Taking? Authorizing Provider  albuterol (VENTOLIN HFA) 108 (90 Base) MCG/ACT inhaler Inhale 2 puffs into the lungs every 6 (six) hours as needed for wheezing or shortness of breath. 03/03/20   Particia Nearing, PA-C  Amino Acids-Protein Hydrolys (FEEDING SUPPLEMENT, PRO-STAT SUGAR FREE 64,) LIQD Take 30 mLs by mouth 2 (two) times daily. 07/05/17   Richarda Overlie, MD  atorvastatin (LIPITOR) 20 MG tablet Take 1 tablet (20 mg total) by mouth daily at 6 PM. 07/05/17   Richarda Overlie, MD  dextromethorphan-guaiFENesin (MUCINEX DM) 30-600 MG 12hr tablet Take 2 tablets by mouth 2 (two) times daily.  07/05/17   Richarda Overlie, MD  famotidine (PEPCID) 20 MG tablet Take 1 tablet (20 mg total) by mouth 2 (two) times daily. 07/05/17   Richarda Overlie, MD  folic acid (FOLVITE) 1 MG tablet Take 1 tablet (1 mg total) by mouth daily. 07/05/17   Richarda Overlie, MD  HYDROcodone-homatropine (HYCODAN) 5-1.5 MG/5ML syrup Take 5 mLs by mouth every 4 (four) hours as needed for cough. 07/05/17   Richarda Overlie, MD  Multiple Vitamin (MULTIVITAMIN WITH MINERALS) TABS tablet Take 1 tablet by mouth daily. 07/05/17   Richarda Overlie, MD  potassium chloride SA (K-DUR,KLOR-CON) 20 MEQ tablet Take 2 tablets (40 mEq total) by mouth 2 (two) times daily. 07/05/17   Richarda Overlie, MD  simethicone (MYLICON) 80 MG chewable tablet Chew 2 tablets (160 mg total) by mouth 4 (four) times daily. 07/05/17   Richarda Overlie, MD  thiamine 100 MG tablet Take 1 tablet (100 mg total) by mouth daily. 07/05/17   Richarda Overlie, MD    Family History Family History  Problem Relation Age of Onset  . Pancreatic disease Neg Hx     Social History Social History   Tobacco Use  . Smoking status: Former Games developer  . Smokeless tobacco: Never Used  . Tobacco comment: stopped 30 years ago  Substance Use Topics  . Alcohol use: Not Currently    Comment: stopped drinking alcohol in 2019  . Drug use: No     Allergies  Patient has no known allergies.   Review of Systems Review of Systems  Gastrointestinal: Positive for abdominal pain and diarrhea. Negative for abdominal distention, blood in stool, nausea and vomiting.  All other systems reviewed and are negative.    Physical Exam Triage Vital Signs ED Triage Vitals  Enc Vitals Group     BP 06/25/20 0847 124/80     Pulse Rate 06/25/20 0847 70     Resp 06/25/20 0847 17     Temp 06/25/20 0847 98.2 F (36.8 C)     Temp Source 06/25/20 0847 Oral     SpO2 06/25/20 0847 94 %     Weight --      Height --      Head Circumference --      Peak Flow --      Pain Score 06/25/20 0849 5     Pain Loc  --      Pain Edu? --      Excl. in GC? --    No data found.  Updated Vital Signs BP 124/80 (BP Location: Left Arm)   Pulse 70   Temp 98.2 F (36.8 C) (Oral)   Resp 17   SpO2 94%   Visual Acuity Right Eye Distance:   Left Eye Distance:   Bilateral Distance:    Right Eye Near:   Left Eye Near:    Bilateral Near:     Physical Exam Vitals and nursing note reviewed.  Constitutional:      General: He is not in acute distress.    Appearance: Normal appearance. He is not ill-appearing, toxic-appearing or diaphoretic.  HENT:     Head: Normocephalic and atraumatic.  Eyes:     Conjunctiva/sclera: Conjunctivae normal.  Cardiovascular:     Rate and Rhythm: Normal rate and regular rhythm.     Pulses: Normal pulses.     Heart sounds: Normal heart sounds.  Pulmonary:     Effort: Pulmonary effort is normal.     Breath sounds: Normal breath sounds.  Abdominal:     General: Abdomen is flat. Bowel sounds are normal. There is no distension.     Palpations: Abdomen is soft. There is no hepatomegaly, splenomegaly or mass.     Tenderness: There is no abdominal tenderness. There is no guarding or rebound. Negative signs include Murphy's sign, Rovsing's sign, McBurney's sign, psoas sign and obturator sign.     Hernia: No hernia is present.  Musculoskeletal:        General: Normal range of motion.     Cervical back: Normal range of motion.  Skin:    General: Skin is warm and dry.  Neurological:     General: No focal deficit present.     Mental Status: He is alert and oriented to person, place, and time.  Psychiatric:        Mood and Affect: Mood normal.      UC Treatments / Results  Labs (all labs ordered are listed, but only abnormal results are displayed) Labs Reviewed  COMPREHENSIVE METABOLIC PANEL  CBC WITH DIFFERENTIAL/PLATELET    EKG   Radiology No results found.  Procedures Procedures (including critical care time)  Medications Ordered in UC Medications - No  data to display  Initial Impression / Assessment and Plan / UC Course  I have reviewed the triage vital signs and the nursing notes.  Pertinent labs & imaging results that were available during my care of the patient were reviewed by me and considered in  my medical decision making (see chart for details).     Assessment negative for red flags or concerns, including acute pancreatitis or cholecystitis.  Patient appears well-hydrated.  CBC and CMP pending.  Will contact patient with results and if any additional treatment or evaluation is needed.  Encourage fluid intake especially with water or electrolytes.  Patient requested work note for today and Monday.  Primary care assistance set up.    Final Clinical Impressions(s) / UC Diagnoses   Final diagnoses:  Diarrhea, unspecified type  Epigastric pain     Discharge Instructions     We will contact you with the results of your lab work.  Continue to drink plenty of fluids including water, Gatorade/Powerade/Pedialyte, juice, and tea.    Follow-up with your primary care provider or go to the emergency room if your symptoms do not improve or worsen over the next few days.    ED Prescriptions    None     PDMP not reviewed this encounter.   Ivette Loyal, NP 06/25/20 [phone]

## 2020-06-25 NOTE — ED Triage Notes (Signed)
Pt presents with generalized  abdominal cramping and diarrhea X 2 days.   Pt has diagnosed pancreatitis as of 2019

## 2020-06-25 NOTE — Discharge Instructions (Signed)
We will contact you with the results of your lab work.  Continue to drink plenty of fluids including water, Gatorade/Powerade/Pedialyte, juice, and tea.    Follow-up with your primary care provider or go to the emergency room if your symptoms do not improve or worsen over the next few days.

## 2020-06-28 ENCOUNTER — Telehealth: Payer: Self-pay

## 2020-06-28 NOTE — Telephone Encounter (Signed)
-----   Message from Aaron Edelman, RN sent at 06/25/2020 11:16 AM EDT ----- Regarding: UC to PCP Patient needs to establish with PCP - routine

## 2020-06-28 NOTE — Telephone Encounter (Signed)
Left voice mail to set up new patient appointment 

## 2020-06-29 ENCOUNTER — Encounter: Payer: Self-pay | Admitting: *Deleted

## 2020-08-20 ENCOUNTER — Other Ambulatory Visit: Payer: Self-pay

## 2020-08-20 ENCOUNTER — Ambulatory Visit (HOSPITAL_COMMUNITY)
Admission: EM | Admit: 2020-08-20 | Discharge: 2020-08-20 | Disposition: A | Discharge status: Home / Self Care | Payer: Self-pay | Attending: Medical Oncology | Admitting: Medical Oncology

## 2020-08-20 ENCOUNTER — Encounter (HOSPITAL_COMMUNITY): Payer: Self-pay

## 2020-08-20 DIAGNOSIS — Z20822 Contact with and (suspected) exposure to covid-19: Secondary | ICD-10-CM

## 2020-08-20 DIAGNOSIS — R52 Pain, unspecified: Secondary | ICD-10-CM

## 2020-08-20 DIAGNOSIS — R059 Cough, unspecified: Secondary | ICD-10-CM

## 2020-08-20 LAB — SARS CORONAVIRUS 2 (TAT 6-24 HRS): SARS Coronavirus 2: POSITIVE — AB

## 2020-08-20 MED ORDER — BENZONATATE 100 MG PO CAPS: 100.0000 mg | ORAL_CAPSULE | Freq: Three times a day (TID) | ORAL | 0 refills | Status: DC

## 2020-08-20 MED ORDER — FLUTICASONE PROPIONATE 50 MCG/ACT NA SUSP: 2.0000 | Freq: Every day | NASAL | 0 refills | Status: DC

## 2020-08-20 NOTE — ED Provider Notes (Signed)
[facility]    CSN: [id number] Arrival date & time: 08/20/20  1209      History   Chief Complaint Chief Complaint  Patient presents with   Fatigue   Headache   Cough    HPI Ivan Harper is a 50 y.o. male.   HPI  Cough: Patient reports that he has had a dry cough, headache and fatigue for the past day.  He is concerned as he has multiple household members with COVID-19.  He would like testing for this today.  He denies any chest pain, shortness of breath or peripheral edema. No vomiting and diarrhea. No vomiting or diarrhea.   Past Medical History:  Diagnosis Date   Pancreatitis     Patient Active Problem List   Diagnosis Date Noted   Hospital acquired PNA 07/01/2017   Ileus, unspecified (HCC) 06/30/2017   Abdominal distension    SBO (small bowel obstruction) (HCC) 06/29/2017   Fever    Hypokalemia    Penile swelling 06/28/2017   Acute pancreatitis 06/25/2017   Hyperglycemia 06/25/2017   Alcohol abuse 06/25/2017    Past Surgical History:  Procedure Laterality Date   APPENDECTOMY      Home Medications    Prior to Admission medications   Medication Sig Start Date End Date Taking? Authorizing Provider  albuterol (VENTOLIN HFA) 108 (90 Base) MCG/ACT inhaler Inhale 2 puffs into the lungs every 6 (six) hours as needed for wheezing or shortness of breath. 03/03/20   Particia Nearing, PA-C  Amino Acids-Protein Hydrolys (FEEDING SUPPLEMENT, PRO-STAT SUGAR FREE 64,) LIQD Take 30 mLs by mouth 2 (two) times daily. 07/05/17   Richarda Overlie, MD  atorvastatin (LIPITOR) 20 MG tablet Take 1 tablet (20 mg total) by mouth daily at 6 PM. 07/05/17   Richarda Overlie, MD  dextromethorphan-guaiFENesin (MUCINEX DM) 30-600 MG 12hr tablet Take 2 tablets by mouth 2 (two) times daily. 07/05/17   Richarda Overlie, MD  famotidine (PEPCID) 20 MG tablet Take 1 tablet (20 mg total) by mouth 2 (two) times daily. 07/05/17   Richarda Overlie, MD  folic acid (FOLVITE) 1 MG tablet Take 1  tablet (1 mg total) by mouth daily. 07/05/17   Richarda Overlie, MD  HYDROcodone-homatropine (HYCODAN) 5-1.5 MG/5ML syrup Take 5 mLs by mouth every 4 (four) hours as needed for cough. 07/05/17   Richarda Overlie, MD  Multiple Vitamin (MULTIVITAMIN WITH MINERALS) TABS tablet Take 1 tablet by mouth daily. 07/05/17   Richarda Overlie, MD  potassium chloride SA (K-DUR,KLOR-CON) 20 MEQ tablet Take 2 tablets (40 mEq total) by mouth 2 (two) times daily. 07/05/17   Richarda Overlie, MD  simethicone (MYLICON) 80 MG chewable tablet Chew 2 tablets (160 mg total) by mouth 4 (four) times daily. 07/05/17   Richarda Overlie, MD  thiamine 100 MG tablet Take 1 tablet (100 mg total) by mouth daily. 07/05/17   Richarda Overlie, MD    Family History Family History  Problem Relation Age of Onset   Healthy Mother    Pancreatic disease Neg Hx     Social History Social History   Tobacco Use   Smoking status: Former    Pack years: 0.00   Smokeless tobacco: Never   Tobacco comments:    stopped 30 years ago  Substance Use Topics   Alcohol use: Not Currently    Comment: stopped drinking alcohol in 2019   Drug use: No     Allergies   Patient has no known allergies.   Review of  Systems Review of Systems  As stated above in HPI Physical Exam Triage Vital Signs ED Triage Vitals  Enc Vitals Group     BP 08/20/20 1224 (!) 145/91     Pulse Rate 08/20/20 1224 97     Resp 08/20/20 1224 20     Temp 08/20/20 1224 97.8 F (36.6 C)     Temp Source 08/20/20 1224 Oral     SpO2 08/20/20 1224 98 %     Weight --      Height --      Head Circumference --      Peak Flow --      Pain Score 08/20/20 1223 7     Pain Loc --      Pain Edu? --      Excl. in [place]? --    No data found.  Updated Vital Signs BP (!) 145/91 (BP Location: Right Arm)   Pulse 97   Temp 97.8 F (36.6 C) (Oral)   Resp 20   SpO2 98%   Physical Exam Vitals and nursing note reviewed.  Constitutional:      General: He is not in acute distress.     Appearance: He is well-developed. He is not ill-appearing, toxic-appearing or diaphoretic.  HENT:     Head: Normocephalic and atraumatic.     Mouth/Throat:     Mouth: Mucous membranes are moist.     Pharynx: Oropharynx is clear.  Eyes:     Extraocular Movements: Extraocular movements intact.     Pupils: Pupils are equal, round, and reactive to light.  Cardiovascular:     Rate and Rhythm: Normal rate and regular rhythm.  Pulmonary:     Effort: Pulmonary effort is normal.     Breath sounds: Normal breath sounds.  Abdominal:     Palpations: Abdomen is soft.  Musculoskeletal:     Cervical back: Normal range of motion and neck supple. No rigidity.  Lymphadenopathy:     Cervical: No cervical adenopathy.  Skin:    General: Skin is warm.     Coloration: Skin is not cyanotic.  Neurological:     Mental Status: He is alert and oriented to person, place, and time.     UC Treatments / Results  Labs (all labs ordered are listed, but only abnormal results are displayed) Labs Reviewed - No data to display  EKG   Radiology No results found.  Procedures Procedures (including critical care time)  Medications Ordered in UC Medications - No data to display  Initial Impression / Assessment and Plan / UC Course  I have reviewed the triage vital signs and the nursing notes.  Pertinent labs & imaging results that were available during my care of the patient were reviewed by me and considered in my medical decision making (see chart for details).     New. Likely COVID-19 given his symptoms and exposures. Treating with tessalon and flonase. Testing pending.  Discussed red flag signs and symptoms along with oxygen monitoring.    Final Clinical Impressions(s) / UC Diagnoses   Final diagnoses:  None   Discharge Instructions   None    ED Prescriptions   None    PDMP not reviewed this encounter.   Rushie Chestnut, [place] 08/20/20 1258

## 2020-08-20 NOTE — ED Triage Notes (Signed)
Pt reports fatigue, cough and headache x 1 day. Reports his wife and daughter have COVID.

## 2021-04-19 ENCOUNTER — Other Ambulatory Visit: Payer: Self-pay

## 2021-04-19 ENCOUNTER — Ambulatory Visit (HOSPITAL_COMMUNITY)
Admission: EM | Admit: 2021-04-19 | Discharge: 2021-04-19 | Disposition: A | Discharge status: Home / Self Care | Payer: 59 | Attending: Family Medicine | Admitting: Family Medicine

## 2021-04-19 ENCOUNTER — Ambulatory Visit (INDEPENDENT_AMBULATORY_CARE_PROVIDER_SITE_OTHER): Payer: Self-pay

## 2021-04-19 DIAGNOSIS — M79645 Pain in left finger(s): Secondary | ICD-10-CM

## 2021-04-19 DIAGNOSIS — M25531 Pain in right wrist: Secondary | ICD-10-CM | POA: Diagnosis not present

## 2021-04-19 DIAGNOSIS — M79642 Pain in left hand: Secondary | ICD-10-CM

## 2021-04-19 DIAGNOSIS — W19XXXA Unspecified fall, initial encounter: Secondary | ICD-10-CM

## 2021-04-19 MED ORDER — IBUPROFEN 800 MG PO TABS: 800.0000 mg | ORAL_TABLET | Freq: Three times a day (TID) | ORAL | 0 refills | Status: DC | PRN

## 2021-04-19 NOTE — ED Triage Notes (Signed)
Pt presents with right wrist and left thumb injury after fall 2 days ago.

## 2021-04-19 NOTE — Discharge Instructions (Addendum)
Your x-rays were negative for any broken bones  Take ibuprofen 800 mg 1 tablet every 8 hours as needed for pain.

## 2021-04-19 NOTE — ED Provider Notes (Signed)
[facility]    CSN: [id number] Arrival date & time: 04/19/21  1251      History   Chief Complaint Chief Complaint  Patient presents with   Wrist Pain    HPI Ivan Harper is a 51 y.o. male.    Wrist Pain  Here after a fall 2 days ago.  He was lifting up a heavy pot, started dropping it and then caught it with his right hand.  He felt a pop in his right wrist and then fell onto his right hand with the pot on top of it.  Also his left thumb was struck by the pot and it now hurts.  Past Medical History:  Diagnosis Date   Pancreatitis     Patient Active Problem List   Diagnosis Date Noted   Hospital acquired PNA 07/01/2017   Ileus, unspecified (HCC) 06/30/2017   Abdominal distension    SBO (small bowel obstruction) (HCC) 06/29/2017   Fever    Hypokalemia    Penile swelling 06/28/2017   Acute pancreatitis 06/25/2017   Hyperglycemia 06/25/2017   Alcohol abuse 06/25/2017    Past Surgical History:  Procedure Laterality Date   APPENDECTOMY         Home Medications    Prior to Admission medications   Medication Sig Start Date End Date Taking? Authorizing Provider  ibuprofen (ADVIL) 800 MG tablet Take 1 tablet (800 mg total) by mouth every 8 (eight) hours as needed (pain). 04/19/21  Yes Jennamarie Goings, Janace Aris, MD  albuterol (VENTOLIN HFA) 108 (90 Base) MCG/ACT inhaler Inhale 2 puffs into the lungs every 6 (six) hours as needed for wheezing or shortness of breath. 03/03/20   Particia Nearing, PA-C  Amino Acids-Protein Hydrolys (FEEDING SUPPLEMENT, PRO-STAT SUGAR FREE 64,) LIQD Take 30 mLs by mouth 2 (two) times daily. 07/05/17   Richarda Overlie, MD  atorvastatin (LIPITOR) 20 MG tablet Take 1 tablet (20 mg total) by mouth daily at 6 PM. 07/05/17   Richarda Overlie, MD  benzonatate (TESSALON) 100 MG capsule Take 1 capsule (100 mg total) by mouth every 8 (eight) hours. 08/20/20   Rushie Chestnut, PA-C  dextromethorphan-guaiFENesin (MUCINEX DM) 30-600 MG 12hr  tablet Take 2 tablets by mouth 2 (two) times daily. 07/05/17   Richarda Overlie, MD  famotidine (PEPCID) 20 MG tablet Take 1 tablet (20 mg total) by mouth 2 (two) times daily. 07/05/17   Richarda Overlie, MD  fluticasone (FLONASE) 50 MCG/ACT nasal spray Place 2 sprays into both nostrils daily. 08/20/20   Rushie Chestnut, PA-C  folic acid (FOLVITE) 1 MG tablet Take 1 tablet (1 mg total) by mouth daily. 07/05/17   Richarda Overlie, MD  HYDROcodone-homatropine (HYCODAN) 5-1.5 MG/5ML syrup Take 5 mLs by mouth every 4 (four) hours as needed for cough. 07/05/17   Richarda Overlie, MD  Multiple Vitamin (MULTIVITAMIN WITH MINERALS) TABS tablet Take 1 tablet by mouth daily. 07/05/17   Richarda Overlie, MD  potassium chloride SA (K-DUR,KLOR-CON) 20 MEQ tablet Take 2 tablets (40 mEq total) by mouth 2 (two) times daily. 07/05/17   Richarda Overlie, MD  simethicone (MYLICON) 80 MG chewable tablet Chew 2 tablets (160 mg total) by mouth 4 (four) times daily. 07/05/17   Richarda Overlie, MD  thiamine 100 MG tablet Take 1 tablet (100 mg total) by mouth daily. 07/05/17   Richarda Overlie, MD    Family History Family History  Problem Relation Age of Onset   Healthy Mother    Pancreatic disease Neg Hx  Social History Social History   Tobacco Use   Smoking status: Former   Smokeless tobacco: Never   Tobacco comments:    stopped 30 years ago  Substance Use Topics   Alcohol use: Not Currently    Comment: stopped drinking alcohol in 2019   Drug use: No     Allergies   Patient has no known allergies.   Review of Systems Review of Systems   Physical Exam Triage Vital Signs ED Triage Vitals  Enc Vitals Group     BP 04/19/21 1402 (!) 135/91     Pulse Rate 04/19/21 1402 70     Resp 04/19/21 1402 17     Temp 04/19/21 1402 97.6 F (36.4 C)     Temp Source 04/19/21 1402 Oral     SpO2 04/19/21 1402 100 %     Weight --      Height --      Head Circumference --      Peak Flow --      Pain Score 04/19/21 1404 7     Pain  Loc --      Pain Edu? --      Excl. in [place]? --    No data found.  Updated Vital Signs BP (!) 135/91 (BP Location: Right Arm)    Pulse 70    Temp 97.6 F (36.4 C) (Oral)    Resp 17    SpO2 100%   Visual Acuity Right Eye Distance:   Left Eye Distance:   Bilateral Distance:    Right Eye Near:   Left Eye Near:    Bilateral Near:     Physical Exam Vitals reviewed.  Constitutional:      General: He is not in acute distress.    Appearance: He is not ill-appearing, toxic-appearing or diaphoretic.  Musculoskeletal:     Comments: Right wrist has some swelling on the dorsal surface on the ulnar side. No ecchymosis currently. ROM causes some discomfort  Left thumb has a nodule/bump at the DIP joint, tender. ROM ok  Neurological:     General: No focal deficit present.     Mental Status: He is alert and oriented to person, place, and time.  Psychiatric:        Behavior: Behavior normal.     UC Treatments / Results  Labs (all labs ordered are listed, but only abnormal results are displayed) Labs Reviewed - No data to display  EKG   Radiology DG Wrist Complete Right  Result Date: 04/19/2021 CLINICAL DATA:  Right wrist pain after fall EXAM: RIGHT WRIST - COMPLETE 3+ VIEW COMPARISON:  None. FINDINGS: There is no evidence of fracture or dislocation. There is no evidence of arthropathy or other focal bone abnormality. Soft tissues are unremarkable. IMPRESSION: Negative. Electronically Signed   By: Davina Poke D.O.   On: 04/19/2021 14:44   DG Hand Complete Left  Result Date: 04/19/2021 CLINICAL DATA:  Left hand pain after fall 2 days ago. EXAM: LEFT HAND - COMPLETE 3+ VIEW COMPARISON:  None. FINDINGS: There is no evidence of fracture or dislocation. Moderate degenerative changes seen involving the fifth distal interphalangeal joint. Soft tissues are unremarkable. IMPRESSION: No acute abnormality seen. Electronically Signed   By: Marijo Conception M.D.   On: 04/19/2021 14:39     Procedures Procedures (including critical care time)  Medications Ordered in UC Medications - No data to display  Initial Impression / Assessment and Plan / UC Course  I have reviewed the  triage vital signs and the nursing notes.  Pertinent labs & imaging results that were available during my care of the patient were reviewed by me and considered in my medical decision making (see chart for details).     X-rays are negative. Final Clinical Impressions(s) / UC Diagnoses   Final diagnoses:  Right wrist pain  Pain of left thumb     Discharge Instructions      Your x-rays were negative for any broken bones  Take ibuprofen 800 mg 1 tablet every 8 hours as needed for pain.       ED Prescriptions     Medication Sig Dispense Auth. Provider   ibuprofen (ADVIL) 800 MG tablet Take 1 tablet (800 mg total) by mouth every 8 (eight) hours as needed (pain). 21 tablet Ruba Outen, Gwenlyn Perking, MD      PDMP not reviewed this encounter.   Barrett Henle, MD 04/19/21 [phone]

## 2022-07-24 ENCOUNTER — Encounter: Payer: Self-pay | Admitting: Gastroenterology

## 2022-08-30 ENCOUNTER — Ambulatory Visit (HOSPITAL_COMMUNITY)
Admission: EM | Admit: 2022-08-30 | Discharge: 2022-08-30 | Disposition: A | Discharge status: Home / Self Care | Payer: Commercial Managed Care - PPO | Attending: Emergency Medicine | Admitting: Emergency Medicine

## 2022-08-30 ENCOUNTER — Encounter (HOSPITAL_COMMUNITY): Payer: Self-pay

## 2022-08-30 DIAGNOSIS — G8929 Other chronic pain: Secondary | ICD-10-CM

## 2022-08-30 DIAGNOSIS — R634 Abnormal weight loss: Secondary | ICD-10-CM

## 2022-08-30 DIAGNOSIS — R109 Unspecified abdominal pain: Secondary | ICD-10-CM

## 2022-08-30 MED ORDER — FAMOTIDINE 20 MG PO TABS: 20.0000 mg | ORAL_TABLET | Freq: Every day | ORAL | 2 refills | Status: DC

## 2022-08-30 NOTE — ED Provider Notes (Signed)
[facility]    CSN: [id number] Arrival date & time: 08/30/22  1232     History   Chief Complaint Chief Complaint  Patient presents with   Abdominal Pain    unexplained weight loss    HPI Ivan Harper is a 52 y.o. male.  Here with several year history of abdominal pain Reports unintentional 15 pound weight loss in the last 3 months. Cramping sensation sometimes but not today. Rates pain 1/10. Denies nausea, vomiting, diarrhea, constipation Last BM was this morning, normal. No straining, no bleeding. No fevers Not related to eating, not positional  No worsening symptoms recently he was just concerned about the weight loss.  Had pancreatitis in 2019 Hx alcohol abuse, SBO, appendectomy   Has GI appointment on 8/7 Does not have a PCP  Past Medical History:  Diagnosis Date   Pancreatitis     Patient Active Problem List   Diagnosis Date Noted   Hospital acquired PNA 07/01/2017   Ileus, unspecified (HCC) 06/30/2017   Abdominal distension    SBO (small bowel obstruction) (HCC) 06/29/2017   Fever    Hypokalemia    Penile swelling 06/28/2017   Acute pancreatitis 06/25/2017   Hyperglycemia 06/25/2017   Alcohol abuse 06/25/2017    Past Surgical History:  Procedure Laterality Date   APPENDECTOMY      Home Medications    Prior to Admission medications   Medication Sig Start Date End Date Taking? Authorizing Provider  famotidine (PEPCID) 20 MG tablet Take 1 tablet (20 mg total) by mouth daily. 08/30/22  Yes Ansley Stanwood, Lurena Joiner, PA-C  Multiple Vitamin (MULTIVITAMIN WITH MINERALS) TABS tablet Take 1 tablet by mouth daily. 07/05/17  Yes Richarda Overlie, MD    Family History Family History  Problem Relation Age of Onset   Healthy Mother    Pancreatic disease Neg Hx     Social History Social History   Tobacco Use   Smoking status: Former   Smokeless tobacco: Never   Tobacco comments:    stopped 30 years ago  Substance Use Topics   Alcohol use: Not  Currently    Comment: stopped drinking alcohol in 2019   Drug use: No     Allergies   Patient has no known allergies.   Review of Systems Review of Systems As per HPI  Physical Exam Triage Vital Signs ED Triage Vitals  Enc Vitals Group     BP 08/30/22 1246 (!) 151/89     Pulse Rate 08/30/22 1246 65     Resp 08/30/22 1246 16     Temp 08/30/22 1246 98.5 F (36.9 C)     Temp Source 08/30/22 1246 Oral     SpO2 08/30/22 1246 97 %     Weight 08/30/22 1245 145 lb (65.8 kg)     Height 08/30/22 1245 5\' 9"  (1.753 m)     Head Circumference --      Peak Flow --      Pain Score 08/30/22 1244 1     Pain Loc --      Pain Edu? --      Excl. in GC? --    No data found.  Updated Vital Signs BP (!) 151/89 (BP Location: Left Arm)   Pulse 65   Temp 98.5 F (36.9 C) (Oral)   Resp 16   Ht 5\' 9"  (1.753 m)   Wt 145 lb (65.8 kg)   SpO2 97%   BMI 21.41 kg/m    Physical Exam Vitals and  nursing note reviewed.  Constitutional:      General: He is not in acute distress.    Appearance: Normal appearance. He is not ill-appearing, toxic-appearing or diaphoretic.  HENT:     Mouth/Throat:     Mouth: Mucous membranes are moist.     Pharynx: Oropharynx is clear.  Eyes:     General: No scleral icterus.    Conjunctiva/sclera: Conjunctivae normal.  Cardiovascular:     Rate and Rhythm: Normal rate and regular rhythm.     Heart sounds: Normal heart sounds.  Pulmonary:     Effort: Pulmonary effort is normal. No respiratory distress.     Breath sounds: Normal breath sounds.  Abdominal:     General: Bowel sounds are normal. There is no distension.     Palpations: Abdomen is soft. There is no mass.     Tenderness: There is no abdominal tenderness. There is no right CVA tenderness, left CVA tenderness, guarding or rebound.     Hernia: No hernia is present.  Musculoskeletal:        General: Normal range of motion.     Cervical back: Normal range of motion.  Skin:    General: Skin is warm  and dry.  Neurological:     Mental Status: He is alert and oriented to person, place, and time.     Motor: No weakness.     UC Treatments / Results  Labs (all labs ordered are listed, but only abnormal results are displayed) Labs Reviewed - No data to display  EKG  Radiology No results found.  Procedures Procedures (including critical care time)  Medications Ordered in UC Medications - No data to display  Initial Impression / Assessment and Plan / UC Course  I have reviewed the triage vital signs and the nursing notes.  Pertinent labs & imaging results that were available during my care of the patient were reviewed by me and considered in my medical decision making (see chart for details).  He has no symptoms at this time. Stable vitals and normal physical exam. Most concerned about the weight loss.  Chronic issue There is no acute worsening recently. He is well appearing today with stable vitals. He has gastro appointment in 2 months CMA Matt set him up with a primary care for 6/25 (in 6 days). Discussed he needs to follow closely with them and have workup completed. Discussed reasons to be seen in urgent care, and ED precautions   Final Clinical Impressions(s) / UC Diagnoses   Final diagnoses:  Chronic abdominal pain  Unintentional weight loss     Discharge Instructions      Please follow closely with your new primary care provider.   You can try taking pepcid once daily. This is an acid reducer.   Return to urgent care with any acute concerns such as belly pain, nausea or vomiting, diarrhea, etc If you have severe pain or worsening symptoms you should be evaluated in the emergency department.   Keep your appointment with GI in August!      ED Prescriptions     Medication Sig Dispense Auth. Provider   famotidine (PEPCID) 20 MG tablet Take 1 tablet (20 mg total) by mouth daily. 30 tablet Petr Bontempo, Lurena Joiner, PA-C      PDMP not reviewed this encounter.    Marlow Baars, [place] 08/30/22 1412

## 2022-08-30 NOTE — ED Triage Notes (Signed)
Patient here today with c/o abd pain for years. He was diagnose with pancreatitis and has been eating unhealthy but has lost 20 pounds over the past months. He has some cramping. He has been under a lot of stress recently.

## 2022-08-30 NOTE — Discharge Instructions (Addendum)
Please follow closely with your new primary care provider.   You can try taking pepcid once daily. This is an acid reducer.   Return to urgent care with any acute concerns such as belly pain, nausea or vomiting, diarrhea, etc If you have severe pain or worsening symptoms you should be evaluated in the emergency department.   Keep your appointment with GI in August!

## 2022-09-05 ENCOUNTER — Ambulatory Visit (INDEPENDENT_AMBULATORY_CARE_PROVIDER_SITE_OTHER): Payer: Self-pay | Admitting: Family

## 2022-09-05 ENCOUNTER — Encounter: Payer: Self-pay | Admitting: Family

## 2022-09-05 VITALS — BP 118/76 | HR 64 | Temp 97.8°F | Ht 69.0 in | Wt 148.1 lb

## 2022-09-05 DIAGNOSIS — M25511 Pain in right shoulder: Secondary | ICD-10-CM

## 2022-09-05 DIAGNOSIS — R634 Abnormal weight loss: Secondary | ICD-10-CM

## 2022-09-05 DIAGNOSIS — Z1211 Encounter for screening for malignant neoplasm of colon: Secondary | ICD-10-CM

## 2022-09-05 DIAGNOSIS — R1084 Generalized abdominal pain: Secondary | ICD-10-CM

## 2022-09-05 LAB — COMPREHENSIVE METABOLIC PANEL
ALT: 13 U/L (ref 0–53)
AST: 14 U/L (ref 0–37)
Albumin: 4.1 g/dL (ref 3.5–5.2)
Alkaline Phosphatase: 40 U/L (ref 39–117)
BUN: 13 mg/dL (ref 6–23)
CO2: 31 mEq/L (ref 19–32)
Calcium: 9.2 mg/dL (ref 8.4–10.5)
Chloride: 103 mEq/L (ref 96–112)
Creatinine, Ser: 0.83 mg/dL (ref 0.40–1.50)
GFR: 99.99 mL/min (ref >60.00)
Glucose, Bld: 83 mg/dL (ref 70–99)
Potassium: 4.1 mEq/L (ref 3.5–5.1)
Sodium: 138 mEq/L (ref 135–145)
Total Bilirubin: 0.8 mg/dL (ref 0.2–1.2)
Total Protein: 6.9 g/dL (ref 6.0–8.3)

## 2022-09-05 LAB — CBC WITH DIFFERENTIAL/PLATELET
Basophils Absolute: 0 10*3/uL (ref 0.0–0.1)
Basophils Relative: 0.9 % (ref 0.0–3.0)
Eosinophils Absolute: 0.1 10*3/uL (ref 0.0–0.7)
Eosinophils Relative: 2.2 % (ref 0.0–5.0)
HCT: 35.5 % — ABNORMAL LOW (ref 39.0–52.0)
Hemoglobin: 11.6 g/dL — ABNORMAL LOW (ref 13.0–17.0)
Lymphocytes Relative: 47 % — ABNORMAL HIGH (ref 12.0–46.0)
Lymphs Abs: 2.6 10*3/uL (ref 0.7–4.0)
MCHC: 32.7 g/dL (ref 30.0–36.0)
MCV: 97.2 fl (ref 78.0–100.0)
Monocytes Absolute: 0.6 10*3/uL (ref 0.1–1.0)
Monocytes Relative: 10.1 % (ref 3.0–12.0)
Neutro Abs: 2.2 10*3/uL (ref 1.4–7.7)
Neutrophils Relative %: 39.8 % — ABNORMAL LOW (ref 43.0–77.0)
Platelets: 199 10*3/uL (ref 150.0–400.0)
RBC: 3.65 Mil/uL — ABNORMAL LOW (ref 4.22–5.81)
RDW: 13.4 % (ref 11.5–15.5)
WBC: 5.5 10*3/uL (ref 4.0–10.5)

## 2022-09-05 MED ORDER — MELOXICAM 15 MG PO TABS
15.0000 mg | ORAL_TABLET | Freq: Every day | ORAL | 0 refills | Status: DC
Start: 2022-09-05 — End: 2022-10-18

## 2022-09-05 NOTE — Progress Notes (Signed)
New Patient Office Visit  Subjective:  Patient ID: Ivan Harper, male    DOB: 1970-04-09  Age: 52 y.o. MRN: [id number]  CC:  Chief Complaint  Patient presents with   New Patient (Initial Visit)   Weight Loss    Pt c/o weight loss, Pt states he changed his diet 4 months ago,started back eating terrible again to gain weight.    Shoulder Pain    Pt c/o right shoulder pain for months, no injury.    HPI Ivan Harper presents for establishing care today.  Shoulder pain:  pt c/o right shoulder pain for several months. Denies any injury. 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 Has never had evaluated in past. Has tried OTC meds w/only mild relief, also analgesic creams/rubs.   Weight loss:  pt seen in UC recently d/t abd pain and c/o wt loss. States he weighed about 180lbs a year ago, down 30lbs.  Pain at that time 1/10 & advised to keep GI appt -he made himself prior to [facility] visit for 8/7 with [facility]. He reports eating more junk food, fried foods and thinks this has brought on his stomach pain. Reports having pancreatitis in 2019 but no episodes since and states he has avoided alcohol since that episode. Denies nausea or boring pain.  Reports he has appetite and thinks he is eating a normal amount, just not gaining weight. Having normal bowel movements, soft, daily, reports some bloating& heartburn at times, takes TUMS w/relief.  Assessment & Plan:  Generalized abdominal pain - pain not severe, occurs after eating certain foods. Sending referral to [facility] to see if can get in faster than [facility]. Pt concerned d/t hx of pancreatitis, but reassured pt his sx are not consistent w/that, more likely GB/ulcer/GERD. Advised to avoid fried, greasy, high acidic foods, reduce caffeine intake, avoid alcohol. May also try avoiding gluten for 1-2w to see if sx improve, handout provided. Checking labs.  -     Ambulatory referral to Gastroenterology -     CBC with  Differential/Platelet -     Comprehensive metabolic panel  Unintentional weight loss -  -     Ambulatory referral to Gastroenterology -     Comprehensive metabolic panel  Acute pain of right shoulder - sending Mobic, advised on use & SE, continue using OTC analgesic creams prn, use ice after work for up to 20-72minutes, use heat at other times, same amt of time tid prn.   -     Meloxicam; Take 1 tablet (15 mg total) by mouth daily.  Dispense: 30 tablet; Refill: 0  Screening for colon cancer -  -     Ambulatory referral to Gastroenterology   Subjective:    Outpatient Medications Prior to Visit  Medication Sig Dispense Refill   famotidine (PEPCID) 20 MG tablet Take 1 tablet (20 mg total) by mouth daily. 30 tablet 2   Multiple Vitamin (MULTIVITAMIN WITH MINERALS) TABS tablet Take 1 tablet by mouth daily. 30 tablet 1   No facility-administered medications prior to visit.   Past Medical History:  Diagnosis Date   Pancreatitis    Past Surgical History:  Procedure Laterality Date   APPENDECTOMY      Objective:   Today's Vitals: BP 118/76   Pulse 64   Temp 97.8 F (36.6 C) (Temporal)   Ht 5\' 9"  (1.753 m)   Wt 148 lb 2 oz (67.2 kg)   SpO2 99%   BMI  21.87 kg/m   Physical Exam Vitals and nursing note reviewed.  Constitutional:      General: He is not in acute distress.    Appearance: Normal appearance.  HENT:     Head: Normocephalic.  Cardiovascular:     Rate and Rhythm: Normal rate and regular rhythm.  Pulmonary:     Effort: Pulmonary effort is normal.     Breath sounds: Normal breath sounds.  Musculoskeletal:     Right shoulder: Tenderness and bony tenderness present. Decreased range of motion. Decreased strength.     Cervical back: Normal range of motion.  Skin:    General: Skin is warm and dry.  Neurological:     Mental Status: He is alert and oriented to person, place, and time.  Psychiatric:        Mood and Affect: Mood normal.    Meds ordered this  encounter  Medications   meloxicam (MOBIC) 15 MG tablet    Sig: Take 1 tablet (15 mg total) by mouth daily.    Dispense:  30 tablet    Refill:  0    Order Specific Question:   Supervising Provider    Answer:   ANDY, CAMILLE L [[id number]]    Dulce Sellar, NP

## 2022-09-05 NOTE — Patient Instructions (Signed)
Welcome to [facility], It was a pleasure meeting you today!   I will review your lab results via MyChart in a few days.  As discussed, I have sent a referral to our gastroenterology office. If they can see you sooner, call and cancel your appointment with the [facility] gastro office you scheduled with.  Remember to avoid fried, greasy foods to avoid pain after eating. Also remember too many acidic foods can cause pain also. This includes tomatoes, tomato sauces, juices, juicy fruits, too much caffeine, etc. You also can try to avoid eating foods with gluten to see if any difference in your symptoms - see the handout attached.      PLEASE NOTE: If you had any LAB tests please let us know if you have not heard back within a few days. You may see your results on MyChart before we have a chance to review them but we will give you a call once they are reviewed by [place]. If we ordered any REFERRALS today, please let us know if you have not heard from their office within the next week.  Let us know through MyChart if you are needing REFILLS, or have your pharmacy send [place] the request. You can also use MyChart to communicate with me or any office staff.  Please try these tips to maintain a healthy lifestyle: It is important that you exercise regularly at least 30 minutes 5 times a week. Think about what you will eat, plan ahead. Choose whole foods, & think  "clean, green, fresh or frozen" over canned, processed or packaged foods which are more sugary, salty, and fatty. 70 to 75% of food eaten should be fresh vegetables and protein. 2-3  meals daily with healthy snacks between meals, but must be whole fruit, protein or vegetables. Aim to eat over a 10 hour period when you are active, for example, 7am to 5pm, and then STOP after your last meal of the day, drinking only water.  Shorter eating windows, 6-8 hours, are showing benefits in heart disease and blood sugar  regulation. Drink water every day! Shoot for 64 ounces daily = 8 cups, no other drink is as healthy! Fruit juice is best enjoyed in a healthy way, by EATING the fruit.

## 2022-10-04 ENCOUNTER — Ambulatory Visit (INDEPENDENT_AMBULATORY_CARE_PROVIDER_SITE_OTHER): Payer: Self-pay | Admitting: Family

## 2022-10-04 ENCOUNTER — Encounter: Payer: Self-pay | Admitting: Family

## 2022-10-04 VITALS — BP 114/62 | HR 72 | Temp 98.0°F | Ht 69.0 in | Wt 142.8 lb

## 2022-10-04 DIAGNOSIS — Z8719 Personal history of other diseases of the digestive system: Secondary | ICD-10-CM

## 2022-10-04 DIAGNOSIS — Z13220 Encounter for screening for lipoid disorders: Secondary | ICD-10-CM

## 2022-10-04 DIAGNOSIS — D649 Anemia, unspecified: Secondary | ICD-10-CM

## 2022-10-04 LAB — LIPID PANEL
Cholesterol: 160 mg/dL (ref 0–200)
HDL: 57.4 mg/dL (ref >39.00)
LDL Cholesterol: 90 mg/dL (ref 0–99)
NonHDL: 102.5
Total CHOL/HDL Ratio: 3
Triglycerides: 61 mg/dL (ref 0.0–149.0)
VLDL: 12.2 mg/dL (ref 0.0–40.0)

## 2022-10-04 LAB — LIPASE: Lipase: 25 U/L (ref 11.0–59.0)

## 2022-10-04 LAB — AMYLASE: Amylase: 49 U/L (ref 27–131)

## 2022-10-04 NOTE — Patient Instructions (Signed)
It was very nice to see you today!   I will review your lab results via MyChart in a few days.  Continue to avoid fatty, greasy foods, as these can aggravate your gallbladder and cause stomach pain and nausea.  Do NOT miss your gastroenterology appt on 8/7!     PLEASE NOTE:  If you had any lab tests please let us know if you have not heard back within a few days. You may see your results on MyChart before we have a chance to review them but we will give you a call once they are reviewed by [place]. If we ordered any referrals today, please let us know if you have not heard from their office within the next week.

## 2022-10-04 NOTE — Progress Notes (Unsigned)
   Patient ID: Ivan Harper, male    DOB: 01/09/1971, 52 y.o.   MRN: [id number]  Chief Complaint  Patient presents with   Weight Loss    wants to discuss lab results     HPI: Anemia & wt loss:  pt here with his wife today, he was concerned about his results and continued wt loss. I instructed him to increase his intake of iron rich foods, and he has started an OTC iron pill.  pt concerned due to continued wt loss, history pancreatitis.   Assessment & Plan:  History of pancreatitis -     Lipase -     Amylase  Lipid screening -     Lipid panel   Subjective:    Outpatient Medications Prior to Visit  Medication Sig Dispense Refill   meloxicam (MOBIC) 15 MG tablet Take 1 tablet (15 mg total) by mouth daily. (Patient not taking: Reported on 10/04/2022) 30 tablet 0   No facility-administered medications prior to visit.   Past Medical History:  Diagnosis Date   Pancreatitis    Past Surgical History:  Procedure Laterality Date   APPENDECTOMY     No Known Allergies    Objective:    Physical Exam Vitals and nursing note reviewed.  Constitutional:      General: He is not in acute distress.    Appearance: Normal appearance.  HENT:     Head: Normocephalic.  Cardiovascular:     Rate and Rhythm: Normal rate and regular rhythm.  Pulmonary:     Effort: Pulmonary effort is normal.     Breath sounds: Normal breath sounds.  Musculoskeletal:        General: Normal range of motion.     Cervical back: Normal range of motion.  Skin:    General: Skin is warm and dry.  Neurological:     Mental Status: He is alert and oriented to person, place, and time.  Psychiatric:        Mood and Affect: Mood normal.    BP 114/62   Pulse 72   Temp 98 F (36.7 C)   Ht 5\' 9"  (1.753 m)   Wt 142 lb 12.8 oz (64.8 kg)   SpO2 98%   BMI 21.09 kg/m  Wt Readings from Last 3 Encounters:  10/04/22 142 lb 12.8 oz (64.8 kg)  09/05/22 148 lb 2 oz (67.2 kg)  08/30/22 145 lb (65.8 kg)       Dulce Sellar, NP

## 2022-10-05 ENCOUNTER — Encounter: Payer: Self-pay | Admitting: Family

## 2022-10-05 DIAGNOSIS — Z8719 Personal history of other diseases of the digestive system: Secondary | ICD-10-CM | POA: Insufficient documentation

## 2022-10-18 ENCOUNTER — Encounter: Payer: Self-pay | Admitting: Gastroenterology

## 2022-10-18 ENCOUNTER — Ambulatory Visit (INDEPENDENT_AMBULATORY_CARE_PROVIDER_SITE_OTHER): Payer: Self-pay | Admitting: Gastroenterology

## 2022-10-18 VITALS — BP 104/64 | HR 88 | Ht 68.0 in | Wt 151.1 lb

## 2022-10-18 DIAGNOSIS — Z8719 Personal history of other diseases of the digestive system: Secondary | ICD-10-CM

## 2022-10-18 DIAGNOSIS — R634 Abnormal weight loss: Secondary | ICD-10-CM

## 2022-10-18 DIAGNOSIS — D649 Anemia, unspecified: Secondary | ICD-10-CM

## 2022-10-18 DIAGNOSIS — R1013 Epigastric pain: Secondary | ICD-10-CM

## 2022-10-18 MED ORDER — NA SULFATE-K SULFATE-MG SULF 17.5-3.13-1.6 GM/177ML PO SOLN: 1.0000 | Freq: Once | ORAL | 0 refills | Status: AC

## 2022-10-18 NOTE — Patient Instructions (Signed)
_______________________________________________________  If your blood pressure at your visit was 140/90 or greater, please contact your primary care physician to follow up on this.  _______________________________________________________  If you are age 53 or older, your body mass index should be between 23-30. Your Body mass index is 22.98 kg/m. If this is out of the aforementioned range listed, please consider follow up with your Primary Care Provider.  If you are age 73 or younger, your body mass index should be between 19-25. Your Body mass index is 22.98 kg/m. If this is out of the aformentioned range listed, please consider follow up with your Primary Care Provider.   ________________________________________________________  The [facility] providers would like to encourage you to use [facility] to communicate with providers for non-urgent requests or questions.  Due to long hold times on the telephone, sending your provider a message by [facility] may be a faster and more efficient way to get a response.  Please allow 48 business hours for a response.  Please remember that this is for non-urgent requests.  _______________________________________________________  Ivan Harper have been scheduled for an endoscopy and colonoscopy. Please follow the written instructions given to you at your visit today.  Please pick up your prep supplies at the pharmacy within the next 1-3 days.  If you use inhalers (even only as needed), please bring them with you on the day of your procedure.  DO NOT TAKE 7 DAYS PRIOR TO TEST- Trulicity (dulaglutide) Ozempic, Wegovy (semaglutide) Mounjaro (tirzepatide) Bydureon Bcise (exanatide extended release)  DO NOT TAKE 1 DAY PRIOR TO YOUR TEST Rybelsus (semaglutide) Adlyxin (lixisenatide) Victoza (liraglutide) Byetta (exanatide) ___________________________________________________________________________  Your provider has requested that you go to the basement level  for lab work before leaving today. Press "B" on the elevator. The lab is located at the first door on the left as you exit the elevator.  Due to recent changes in healthcare laws, you may see the results of your imaging and laboratory studies on MyChart before your provider has had a chance to review them.  We understand that in some cases there may be results that are confusing or concerning to you. Not all laboratory results come back in the same time frame and the provider may be waiting for multiple results in order to interpret others.  Please give [place] 48 hours in order for your provider to thoroughly review all the results before contacting the office for clarification of your results.   It was a pleasure to see you today!  Thank you for trusting me with your gastrointestinal care!

## 2022-10-18 NOTE — Progress Notes (Signed)
[facility] Consult Note:  History: Ivan Harper 10/18/2022  Referring provider: Dulce Sellar, NP  Reason for consult/chief complaint: hx of pancreatitis, Colon Cancer Screening, and Anemia  Subjective  HPI: 52 year old man referred by primary care after establishing care with them following a recent ED visit for abdominal pain and several months of weight loss.  History of alcohol use and hospitalization for pancreatitis in 2019.  At ED visit on 09/05/2022, labs drawn, no imaging.  Last CTAP during pancreatitis hospitalization April 2019.  Today, he complains of unintentional weight loss that has been persisted for about the past 6 month. He has lost about 30 pounds since the beginning of the year. His weight is currently fluctuates between 145-151 pounds. He states that he attempts to have frequent small meals throughout the day and occasionally has large meals yet feels hungry most of the time. He also states that despite eating, he feels empty.    He reports previously having pancreatitis and states that he has completely cut alcohol from his life after that hospitalization. He has also reduced fried food and has been following a somewhat healthy diet. His BM has improved and his stool tend to be solid. He also been taking iron supplements and states that his stool is dark as a result.  He also complains of intermittent abdominal cramping with eating certain foods.   He has a distant history of smoking and has been dealing with stress due to his wife's recent diagnosis of stage III cervical cancer.  Patient denies diarrhea, constipation, nausea, blood in stool, vomiting, bloating, reflux, or dysphagia.   ROS:  Review of Systems  Constitutional:  Positive for unexpected weight change (weight loss). Negative for appetite change and fever.  HENT:  Negative for trouble swallowing.   Respiratory:  Negative for cough and shortness of breath.    Cardiovascular:  Negative for chest pain.  Gastrointestinal:  Positive for abdominal pain. Negative for abdominal distention, anal bleeding, blood in stool, constipation, diarrhea, nausea, rectal pain and vomiting.  Genitourinary:  Negative for dysuria.  Musculoskeletal:  Negative for back pain.  Skin:  Negative for rash.  Neurological:  Negative for weakness.  All other systems reviewed and are negative.    Past Medical History: Past Medical History:  Diagnosis Date   Abdominal distension    Acute pancreatitis 06/25/2017   Fever    Hyperglycemia 06/25/2017   Hypokalemia    Pancreatitis    Penile swelling 06/28/2017     Past Surgical History: Past Surgical History:  Procedure Laterality Date   APPENDECTOMY       Family History: Family History  Problem Relation Age of Onset   Healthy Mother    Hypertension Father    Diabetes Father    Alcohol abuse Father    Pancreatic disease Neg Hx     Social History: Social History   Socioeconomic History   Marital status: Married    Spouse name: Not on file   Number of children: 2   Years of education: Not on file   Highest education level: Not on file  Occupational History   Occupation: Chef    Employer: [employer]  Tobacco Use   Smoking status: Former    Current packs/day: 0.00    Types: Cigarettes    Quit date: 1988    Years since quitting: 36.6   Smokeless tobacco: Never   Tobacco comments:    stopped 30 years ago  Vaping Use   Vaping status:  Never Used  Substance and Sexual Activity   Alcohol use: Not Currently    Comment: stopped drinking alcohol in 2019   Drug use: No   Sexual activity: Yes  Other Topics Concern   Not on file  Social History Narrative   Not on file   Social Determinants of Health   Financial Resource Strain: Not on file  Food Insecurity: Not on file  Transportation Needs: Not on file  Physical Activity: Not on file  Stress: Not on file  Social Connections: Not on file    Works in food service at [employer]  Allergies: No Known Allergies  Outpatient Meds: Current Outpatient Medications  Medication Sig Dispense Refill   acetaminophen (TYLENOL) 500 MG tablet Take 1,000 mg by mouth as needed.     ibuprofen (ADVIL) 200 MG tablet Take 200-600 mg by mouth as needed.     No current facility-administered medications for this visit.      ___________________________________________________________________ Objective   Exam:  BP 104/64 (BP Location: Left Arm, Patient Position: Sitting, Cuff Size: Normal)   Pulse 88   Ht 5\' 8"  (1.727 m)   Wt 151 lb 2 oz (68.5 kg)   BMI 22.98 kg/m  Wt Readings from Last 3 Encounters:  10/18/22 151 lb 2 oz (68.5 kg)  10/04/22 142 lb 12.8 oz (64.8 kg)  09/05/22 148 lb 2 oz (67.2 kg)   General: well-appearing.  Thin but not cachectic Eyes: sclera anicteric, no redness ENT: oral mucosa moist without lesions, no cervical or supraclavicular lymphadenopathy.  A few teeth in the top, nothing loose.  Edentulous on the bottom. CV: RRR, no JVD, no peripheral edema Resp: clear to auscultation bilaterally, normal RR and effort noted GI: soft, no tenderness, with active bowel sounds. No guarding or palpable organomegaly noted. No bruits Skin; warm and dry, no rash or jaundice noted Neuro: awake, alert and oriented x 3. Normal gross motor function and fluent speech   Labs:     Latest Ref Rng & Units 09/05/2022   11:52 AM 06/25/2020    9:13 AM 07/05/2017    4:40 AM  CBC  WBC 4.0 - 10.5 K/uL 5.5  6.4  18.2   Hemoglobin 13.0 - 17.0 g/dL 16.1  09.6  7.6   Hematocrit 39.0 - 52.0 % 35.5  37.4  22.0   Platelets 150.0 - 400.0 K/uL 199.0  223  507       Latest Ref Rng & Units 09/05/2022   11:52 AM 06/25/2020    9:13 AM 07/05/2017    4:40 AM  CMP  Glucose 70 - 99 mg/dL 83  97  045   BUN 6 - 23 mg/dL 13  8  8    Creatinine 0.40 - 1.50 mg/dL 4.09  8.11  9.14   Sodium 135 - 145 mEq/L 138  139  137   Potassium 3.5 - 5.1 mEq/L  4.1  3.9  3.8   Chloride 96 - 112 mEq/L 103  105  106   CO2 19 - 32 mEq/L 31  30  22    Calcium 8.4 - 10.5 mg/dL 9.2  9.0  8.5   Total Protein 6.0 - 8.3 g/dL 6.9  6.9  6.8   Total Bilirubin 0.2 - 1.2 mg/dL 0.8  0.4  1.4   Alkaline Phos 39 - 117 U/L 40  45  112   AST 0 - 37 U/L 14  16  123   ALT 0 - 53 U/L 13  14  41    - last iron studies were done in April 2019.   Radiologic Studies:  CLINICAL DATA:  Followup pancreatitis and small bowel obstruction.   EXAM: CT ABDOMEN AND PELVIS WITH CONTRAST   TECHNIQUE: Multidetector CT imaging of the abdomen and pelvis was performed using the standard protocol following bolus administration of intravenous contrast.   CONTRAST:  ISOVUE-300 IOPAMIDOL (ISOVUE-300) INJECTION 61%   COMPARISON:  06/25/2017   FINDINGS: Lower Chest: Increased bilateral pleural effusions and bibasilar atelectasis, left side greater than right.   Hepatobiliary: No hepatic masses identified. Gallbladder is unremarkable. No evidence of biliary ductal dilatation.   Pancreas: The pancreatic tail remains swollen with near complete absence of contrast enhancement, consistent with pancreatic necrosis. Increased multilocular fluid collections are seen within the left upper and lower quadrants with extension along the left pelvic sidewall. Increased mild free fluid also seen in the right upper quadrant and pelvis.   Spleen: Within normal limits in size and appearance.   Adrenals/Urinary Tract: No masses identified. Stable small left renal cyst. No evidence of hydronephrosis. Unremarkable unopacified urinary bladder.   Stomach/Bowel: Dilated contrast filled small bowel loops are seen in the lower abdomen and pelvis, with transition point in the anterior right lower quadrant, suspicious for partial small bowel obstruction.   Vascular/Lymphatic: No pathologically enlarged lymph nodes. No abdominal aortic aneurysm. Aortic atherosclerosis.   Reproductive:   No mass or other significant abnormality.   Other:  None.   Musculoskeletal:  No suspicious bone lesions identified.   IMPRESSION: Pancreatic tail necrosis, with increased multilocular fluid collections in the left abdomen and pelvis, consistent with developing pseudocysts. Increase small amount of intraperitoneal fluid also noted.   Dilated small bowel loops with transition point in anterior right lower quadrant, suspicious for small bowel obstruction.   Increased bilateral pleural effusions and bibasilar atelectasis.     Electronically Signed   By: Myles Rosenthal M.D.   On: 07/02/2017 18:28  Assessment: History of pancreatitis  Epigastric pain  Normocytic anemia  Unintended weight loss   At least several months of intermittent postprandial upper abdominal pain with a prior history of alcohol abuse and pancreatitis culminating in a hospitalization episode April 2019.  Abstinence since then, but must consider possibility of pseudocysts or malignancy. Consider gallstones, H. pylori, ulcer.  Exam not consistent with obstruction.  No prior colorectal cancer screening.  Normocytic anemia without check of iron B12 or folate, but he was put on iron tablets by primary care.  Plan: -CBC, Iron studies, B12 and folate today, then decide whether or not he needs iron. -CT Abdomen and pelvis with oral and IV contrast to evaluate for evidence of chronic pancreatitis, cystic changes or neoplasia. -Colonoscopy and EGD.  He was agreeable after discussion procedure and risks.  The benefits and risks of the planned procedure were described in detail with the patient or (when appropriate) their health care proxy.  Risks were outlined as including, but not limited to, bleeding, infection, perforation, adverse medication reaction leading to cardiac or pulmonary decompensation, pancreatitis (if ERCP).  The limitation of incomplete mucosal visualization was also discussed.  No guarantees or warranties  were given.   Thank you for the courtesy of this consult.  Please call me with any questions or concerns.   I,Safa M Kadhim,acting as a scribe for Charlie Pitter III, MD.,have documented all relevant documentation on the behalf of Ivan Rist, MD,as directed by  Charlie Pitter III, MD while in the presence  of Ivan Rist, MD.   Marvis Repress III, MD, have reviewed all documentation for this visit. The documentation on 10/18/22 for the exam, diagnosis, procedures, and orders are all accurate and complete.    CC: Referring provider noted above

## 2022-10-18 NOTE — Progress Notes (Deleted)
[facility] Consult Note:  History: Ivan Harper 10/18/2022  Referring provider: Dulce Sellar, NP  Reason for consult/chief complaint: hx of pancreatitis, Colon Cancer Screening, and Anemia   Subjective  HPI: 52 year old man referred by primary care after establishing care with them following a recent ED visit for abdominal pain and several months of weight loss.  History of alcohol use and hospitalization for pancreatitis in 2019.  At ED visit on 09/05/2022, labs drawn, no imaging.  Last CTAP during pancreatitis hospitalization April 2019. ***   ROS:  Review of Systems   Past Medical History: Past Medical History:  Diagnosis Date   Abdominal distension    Acute pancreatitis 06/25/2017   Fever    Hyperglycemia 06/25/2017   Hypokalemia    Pancreatitis    Penile swelling 06/28/2017     Past Surgical History: Past Surgical History:  Procedure Laterality Date   APPENDECTOMY       Family History: Family History  Problem Relation Age of Onset   Healthy Mother    Hypertension Father    Diabetes Father    Alcohol abuse Father    Pancreatic disease Neg Hx     Social History: Social History   Socioeconomic History   Marital status: Married    Spouse name: Not on file   Number of children: 2   Years of education: Not on file   Highest education level: Not on file  Occupational History   Occupation: Chef    Employer: [employer]  Tobacco Use   Smoking status: Former    Current packs/day: 0.00    Types: Cigarettes    Quit date: 1988    Years since quitting: 36.6   Smokeless tobacco: Never   Tobacco comments:    stopped 30 years ago  Vaping Use   Vaping status: Never Used  Substance and Sexual Activity   Alcohol use: Not Currently    Comment: stopped drinking alcohol in 2019   Drug use: No   Sexual activity: Yes  Other Topics Concern   Not on file  Social History Narrative   Not on file   Social Determinants of  Health   Financial Resource Strain: Not on file  Food Insecurity: Not on file  Transportation Needs: Not on file  Physical Activity: Not on file  Stress: Not on file  Social Connections: Not on file    Allergies: No Known Allergies  Outpatient Meds: Current Outpatient Medications  Medication Sig Dispense Refill   acetaminophen (TYLENOL) 500 MG tablet Take 1,000 mg by mouth as needed.     ibuprofen (ADVIL) 200 MG tablet Take 200-600 mg by mouth as needed.     No current facility-administered medications for this visit.      ___________________________________________________________________ Objective   Exam:  BP 104/64 (BP Location: Left Arm, Patient Position: Sitting, Cuff Size: Normal)   Pulse 88   Ht 5\' 8"  (1.727 m)   Wt 151 lb 2 oz (68.5 kg)   BMI 22.98 kg/m  Wt Readings from Last 3 Encounters:  10/18/22 151 lb 2 oz (68.5 kg)  10/04/22 142 lb 12.8 oz (64.8 kg)  09/05/22 148 lb 2 oz (67.2 kg)    General: ***  Eyes: sclera anicteric, no redness ENT: oral mucosa moist without lesions, no cervical or supraclavicular lymphadenopathy CV: ***, no JVD, no peripheral edema Resp: clear to auscultation bilaterally, normal RR and effort noted GI: soft, *** tenderness, with active bowel sounds. No guarding or palpable organomegaly noted.  Skin; warm and dry, no rash or jaundice noted Neuro: awake, alert and oriented x 3. Normal gross motor function and fluent speech  Labs:     Latest Ref Rng & Units 09/05/2022   11:52 AM 06/25/2020    9:13 AM 07/05/2017    4:40 AM  CBC  WBC 4.0 - 10.5 K/uL 5.5  6.4  18.2   Hemoglobin 13.0 - 17.0 g/dL 62.9  52.8  7.6   Hematocrit 39.0 - 52.0 % 35.5  37.4  22.0   Platelets 150.0 - 400.0 K/uL 199.0  223  507       Latest Ref Rng & Units 09/05/2022   11:52 AM 06/25/2020    9:13 AM 07/05/2017    4:40 AM  CMP  Glucose 70 - 99 mg/dL 83  97  413   BUN 6 - 23 mg/dL 13  8  8    Creatinine 0.40 - 1.50 mg/dL 2.44  0.10  2.72   Sodium 135 -  145 mEq/L 138  139  137   Potassium 3.5 - 5.1 mEq/L 4.1  3.9  3.8   Chloride 96 - 112 mEq/L 103  105  106   CO2 19 - 32 mEq/L 31  30  22    Calcium 8.4 - 10.5 mg/dL 9.2  9.0  8.5   Total Protein 6.0 - 8.3 g/dL 6.9  6.9  6.8   Total Bilirubin 0.2 - 1.2 mg/dL 0.8  0.4  1.4   Alkaline Phos 39 - 117 U/L 40  45  112   AST 0 - 37 U/L 14  16  123   ALT 0 - 53 U/L 13  14  78      Radiologic Studies:  CLINICAL DATA:  Followup pancreatitis and small bowel obstruction.   EXAM: CT ABDOMEN AND PELVIS WITH CONTRAST   TECHNIQUE: Multidetector CT imaging of the abdomen and pelvis was performed using the standard protocol following bolus administration of intravenous contrast.   CONTRAST:  ISOVUE-300 IOPAMIDOL (ISOVUE-300) INJECTION 61%   COMPARISON:  06/25/2017   FINDINGS: Lower Chest: Increased bilateral pleural effusions and bibasilar atelectasis, left side greater than right.   Hepatobiliary: No hepatic masses identified. Gallbladder is unremarkable. No evidence of biliary ductal dilatation.   Pancreas: The pancreatic tail remains swollen with near complete absence of contrast enhancement, consistent with pancreatic necrosis. Increased multilocular fluid collections are seen within the left upper and lower quadrants with extension along the left pelvic sidewall. Increased mild free fluid also seen in the right upper quadrant and pelvis.   Spleen: Within normal limits in size and appearance.   Adrenals/Urinary Tract: No masses identified. Stable small left renal cyst. No evidence of hydronephrosis. Unremarkable unopacified urinary bladder.   Stomach/Bowel: Dilated contrast filled small bowel loops are seen in the lower abdomen and pelvis, with transition point in the anterior right lower quadrant, suspicious for partial small bowel obstruction.   Vascular/Lymphatic: No pathologically enlarged lymph nodes. No abdominal aortic aneurysm. Aortic atherosclerosis.    Reproductive:  No mass or other significant abnormality.   Other:  None.   Musculoskeletal:  No suspicious bone lesions identified.   IMPRESSION: Pancreatic tail necrosis, with increased multilocular fluid collections in the left abdomen and pelvis, consistent with developing pseudocysts. Increase small amount of intraperitoneal fluid also noted.   Dilated small bowel loops with transition point in anterior right lower quadrant, suspicious for small bowel obstruction.   Increased bilateral pleural effusions and bibasilar atelectasis.  Electronically Signed   By: Myles Rosenthal M.D.   On: 07/02/2017 18:28  Assessment: No diagnosis found.  ***  Plan:  ***  Thank you for the courtesy of this consult.  Please call me with any questions or concerns.  Charlie Pitter III  CC: Referring provider noted above

## 2022-11-23 ENCOUNTER — Telehealth: Payer: Self-pay | Admitting: Gastroenterology

## 2022-11-23 ENCOUNTER — Encounter: Payer: Self-pay | Admitting: Gastroenterology

## 2022-11-23 NOTE — Telephone Encounter (Signed)
Hi Dr Myrtie Neither,   I called patient regarding his procedure appointment, he did not respond and has not arrived for his procedure. I will no show him for his procedure appointment today.   Thank you

## 2022-12-15 ENCOUNTER — Other Ambulatory Visit: Payer: Self-pay | Admitting: Family

## 2022-12-15 DIAGNOSIS — Z1212 Encounter for screening for malignant neoplasm of rectum: Secondary | ICD-10-CM

## 2022-12-15 DIAGNOSIS — Z1211 Encounter for screening for malignant neoplasm of colon: Secondary | ICD-10-CM

## 2023-08-30 IMAGING — DX DG HAND COMPLETE 3+V*L*
3 series · 3 of 3 positions shown · non-contrast
Comparison: None.

CLINICAL DATA: Left hand pain after fall 2 days ago.

EXAM:
LEFT HAND - COMPLETE 3+ VIEW

[hand pa]
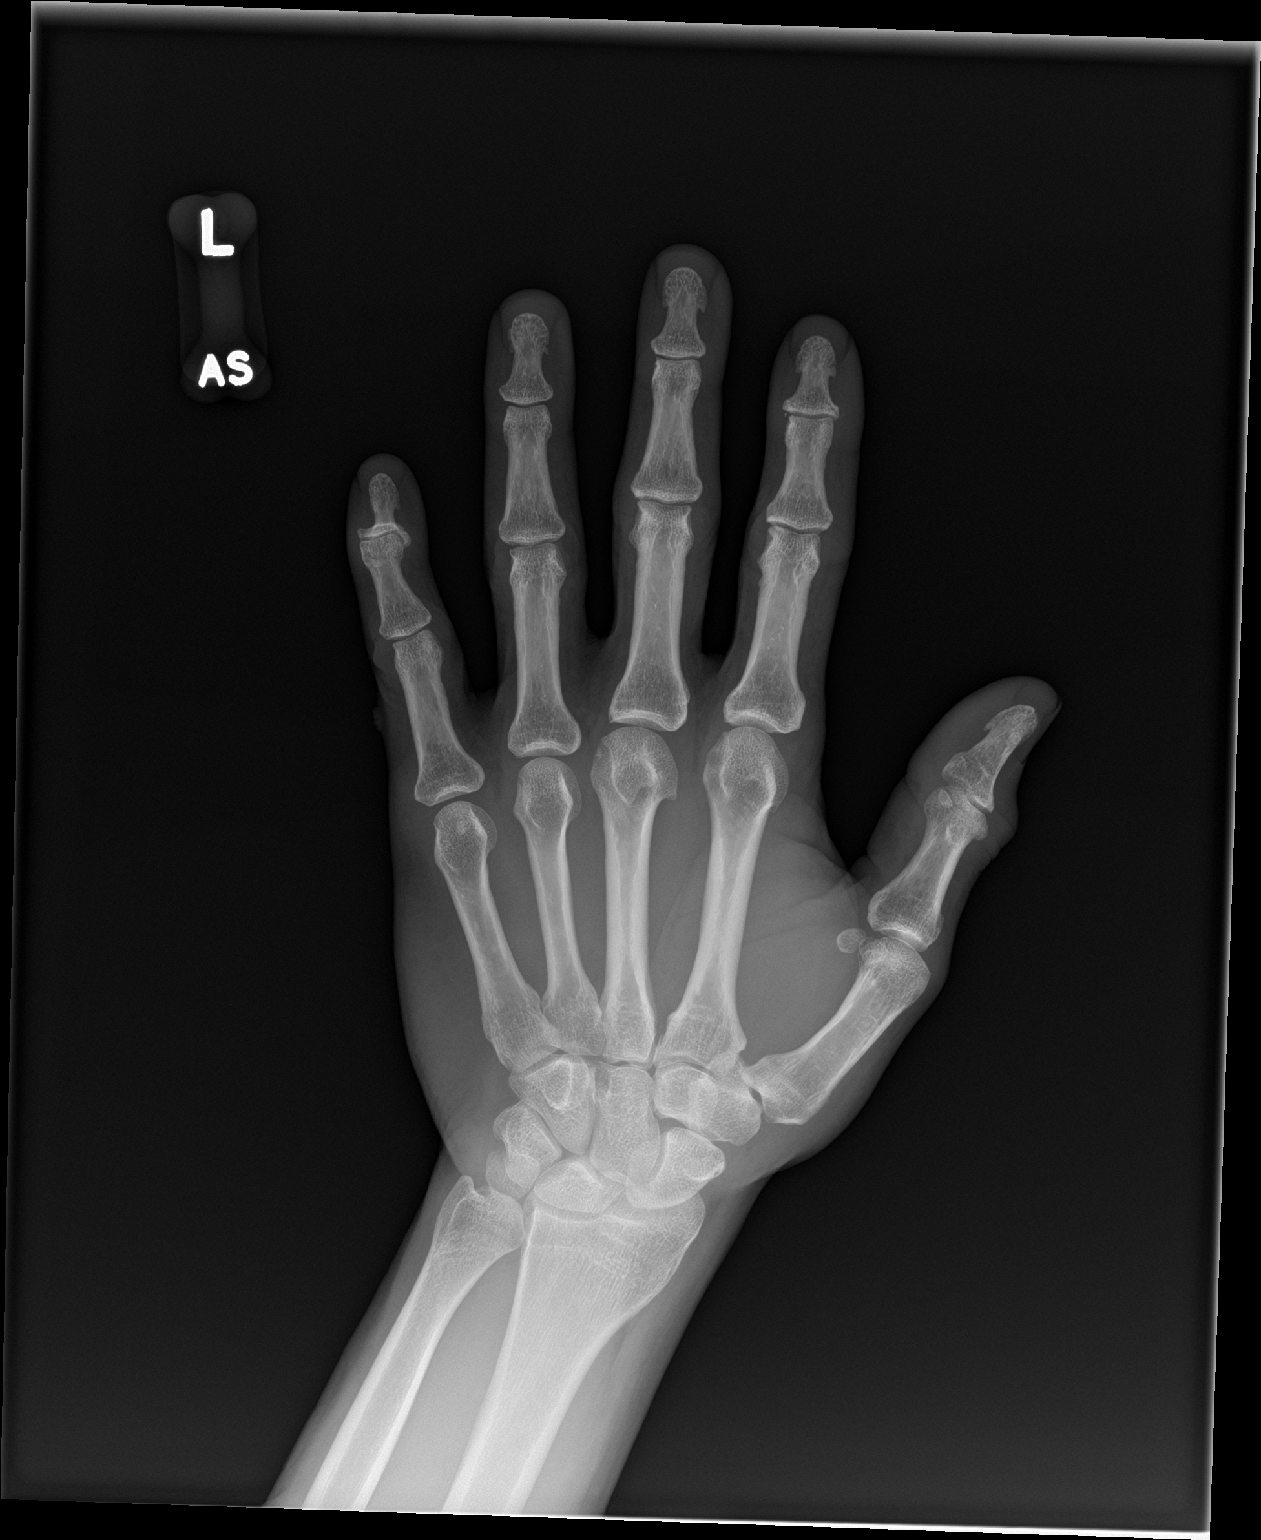

[hand obl]
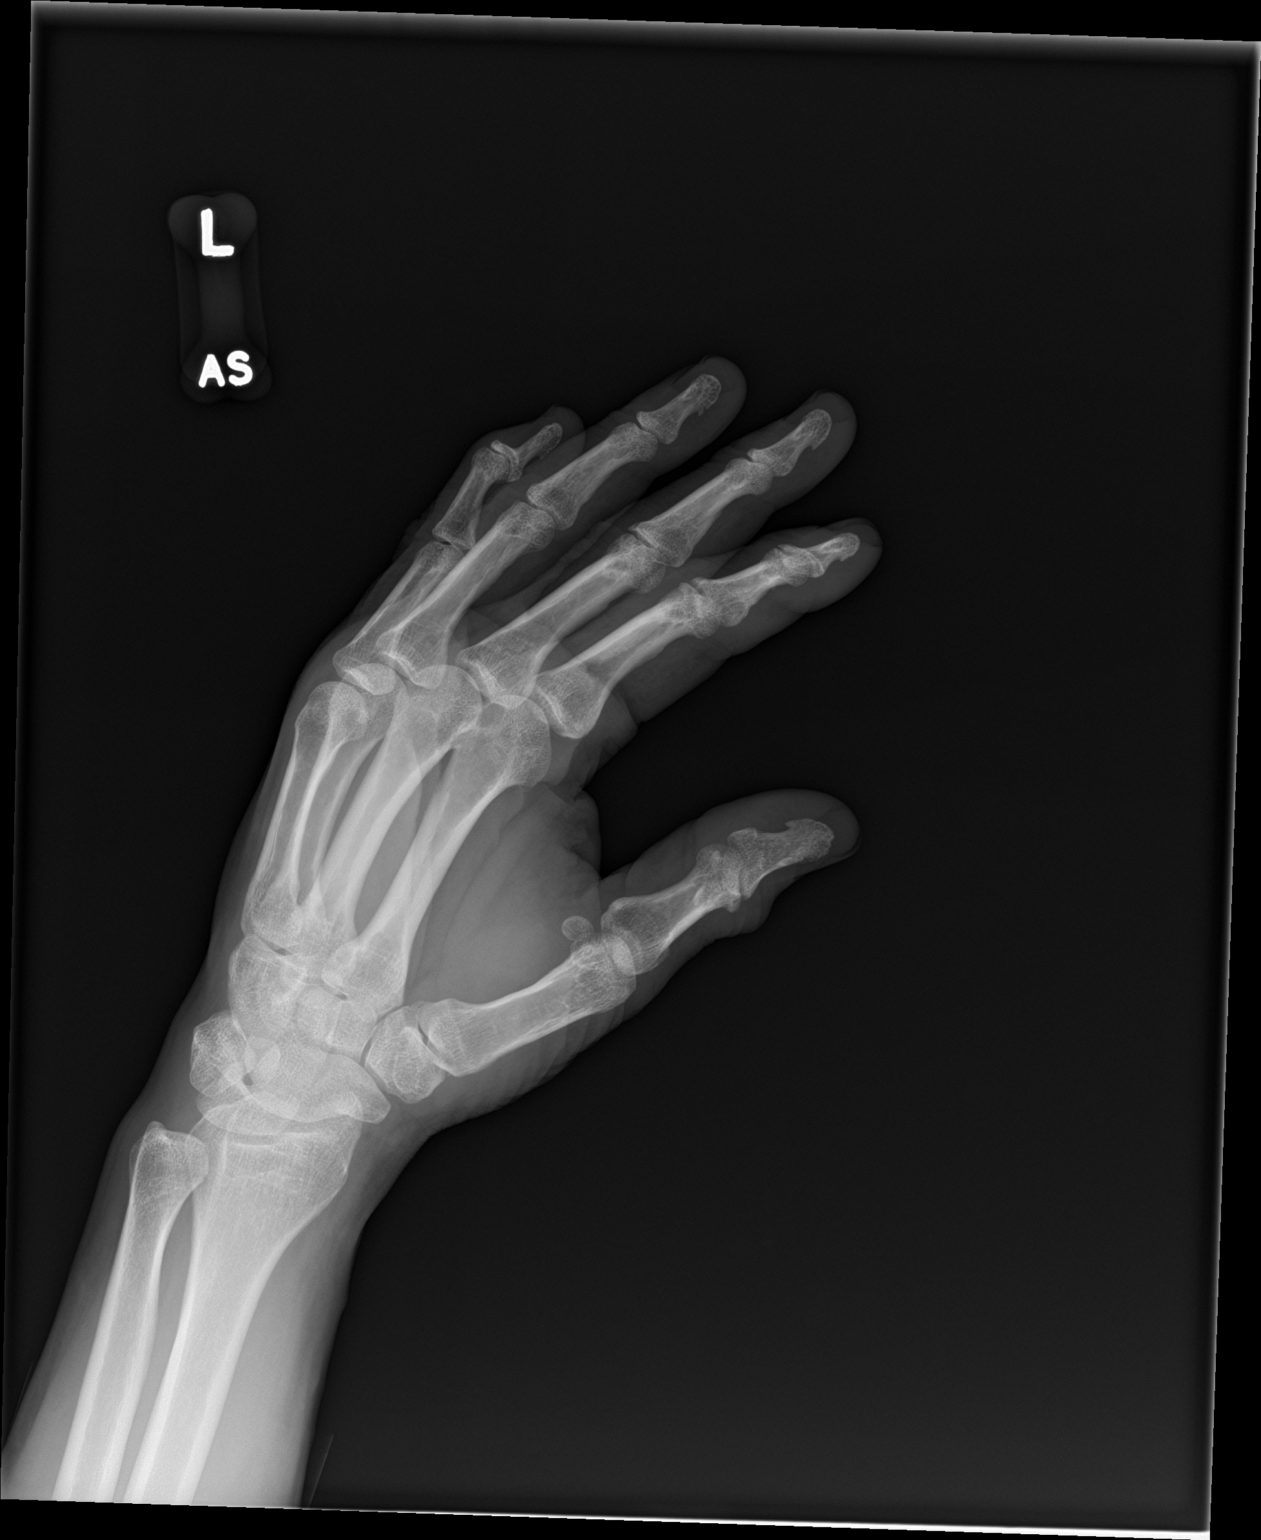

[hand lat]
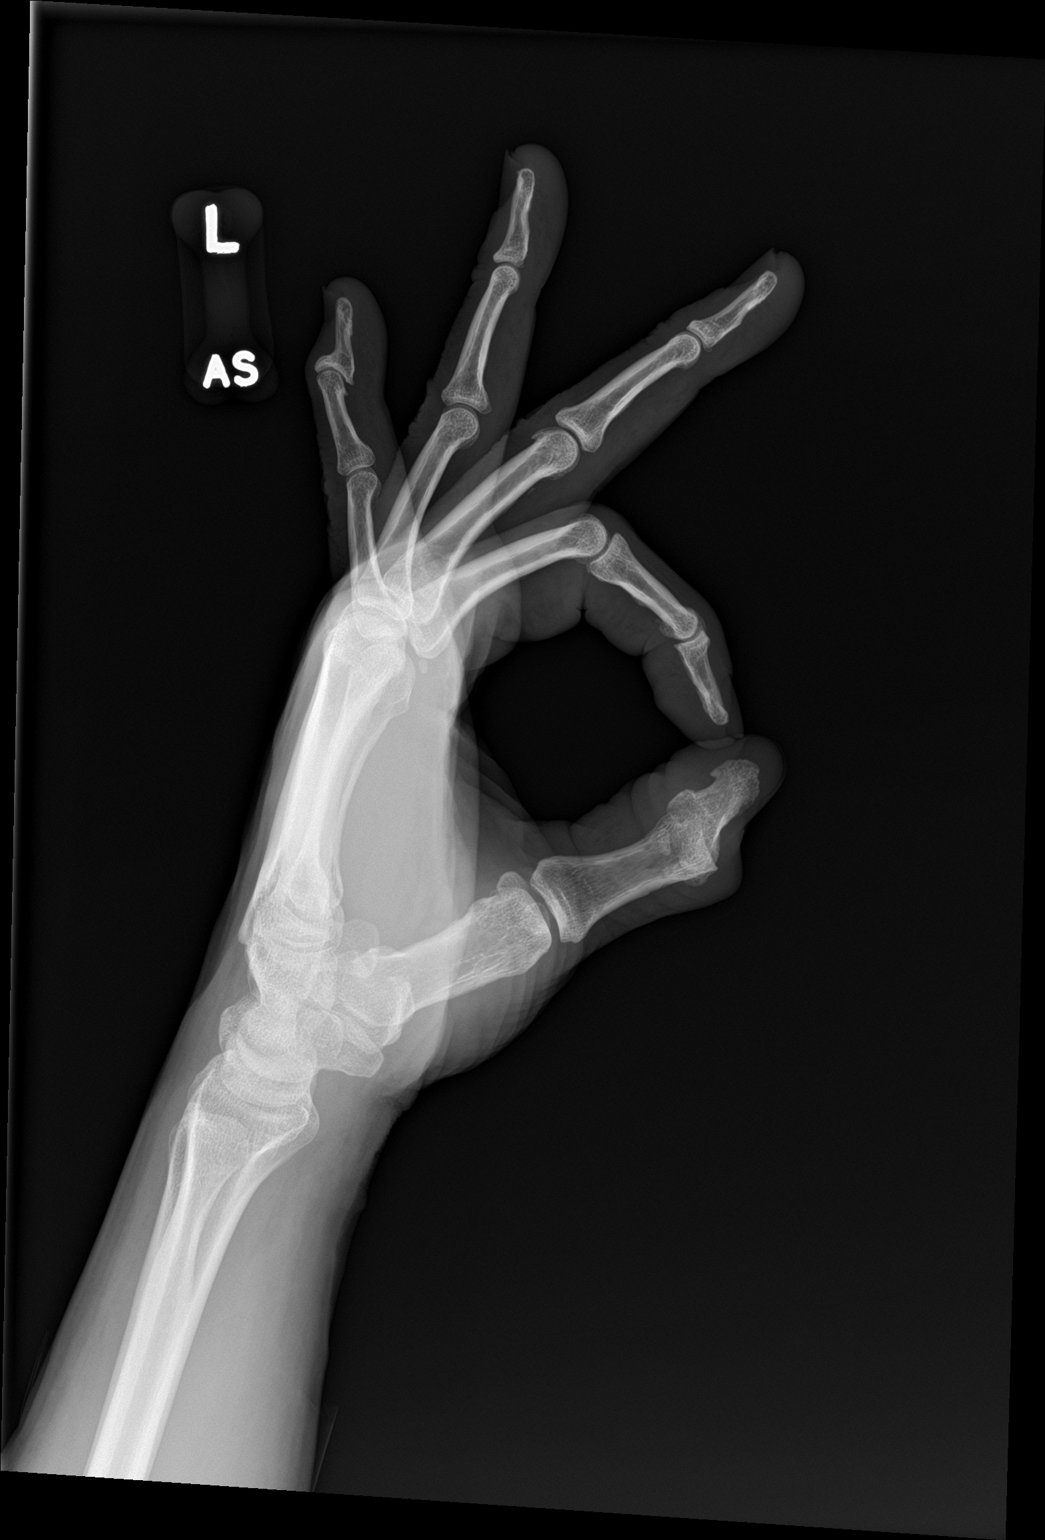

[3 of 3 positions shown; findings below may reference images not displayed]

FINDINGS: There is no evidence of fracture or dislocation. Moderate
degenerative changes seen involving the fifth distal interphalangeal
joint. Soft tissues are unremarkable.
IMPRESSION: No acute abnormality seen.

## 2024-02-01 ENCOUNTER — Encounter (HOSPITAL_COMMUNITY): Payer: Self-pay

## 2024-02-01 ENCOUNTER — Ambulatory Visit (HOSPITAL_COMMUNITY)
Admission: EM | Admit: 2024-02-01 | Discharge: 2024-02-01 | Disposition: A | Discharge status: Home / Self Care | Payer: Self-pay | Attending: Emergency Medicine | Admitting: Emergency Medicine

## 2024-02-01 DIAGNOSIS — G510 Bell's palsy: Secondary | ICD-10-CM

## 2024-02-01 MED ORDER — PREDNISONE 20 MG PO TABS: 60.0000 mg | ORAL_TABLET | Freq: Every day | ORAL | 0 refills | Status: AC

## 2024-02-01 MED ORDER — VALACYCLOVIR HCL 1 G PO TABS: 1000.0000 mg | ORAL_TABLET | Freq: Three times a day (TID) | ORAL | 0 refills | Status: AC

## 2024-02-01 NOTE — ED Triage Notes (Signed)
 Patient here today with c/o left side facial paralysis X 2 days. 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

## 2024-02-01 NOTE — ED Provider Notes (Signed)
 [facility]    CSN: [id number] Arrival date & time: 02/01/24  1444      History   Chief Complaint Chief Complaint  Patient presents with   Facial Droop    Left side    HPI Ivan Harper is a 53 y.o. male. L sided facial droop since yesterday, worse today. No numbness, tingling, pain, rash, LUE or LLE weakness, or confusion. 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 No rash at that time either. Did have chicken pox as a child, has not had shingles or shingles vaccine.   HPI  Past Medical History:  Diagnosis Date   Abdominal distension    Acute pancreatitis 06/25/2017   Fever    Hyperglycemia 06/25/2017   Hypokalemia    Pancreatitis    Penile swelling 06/28/2017    Patient Active Problem List   Diagnosis Date Noted   History of pancreatitis 10/05/2022   Hospital acquired PNA 07/01/2017   Ileus, unspecified (HCC) 06/30/2017   SBO (small bowel obstruction) (HCC) 06/29/2017   History of alcohol abuse 06/25/2017    Past Surgical History:  Procedure Laterality Date   APPENDECTOMY         Home Medications    Prior to Admission medications   Medication Sig Start Date End Date Taking? Authorizing Provider  predniSONE  (DELTASONE ) 20 MG tablet Take 3 tablets (60 mg total) by mouth daily with breakfast for 7 days. 02/01/24 02/08/24 Yes Richad Jon HERO, NP  valACYclovir  (VALTREX ) 1000 MG tablet Take 1 tablet (1,000 mg total) by mouth 3 (three) times daily for 7 days. 02/01/24 02/08/24 Yes Richad Jon HERO, NP  acetaminophen  (TYLENOL ) 500 MG tablet Take 1,000 mg by mouth as needed.    [provider]  ibuprofen  (ADVIL ) 200 MG tablet Take 200-600 mg by mouth as needed.    [provider]    Family History Family History  Problem Relation Age of Onset   Healthy Mother    Hypertension Father    Diabetes Father    Alcohol abuse Father    Pancreatic disease Neg Hx     Social History Social  History   Tobacco Use   Smoking status: Former    Current packs/day: 0.00    Types: Cigarettes    Quit date: 1988    Years since quitting: 37.9   Smokeless tobacco: Never   Tobacco comments:    stopped 30 years ago  Vaping Use   Vaping status: Never Used  Substance Use Topics   Alcohol use: Not Currently    Comment: stopped drinking alcohol in 2019   Drug use: No     Allergies   Patient has no known allergies.   Review of Systems Review of Systems   Physical Exam Triage Vital Signs ED Triage Vitals  Encounter Vitals Group     BP 02/01/24 1543 (!) 137/93     Girls Systolic BP Percentile --      Girls Diastolic BP Percentile --      Boys Systolic BP Percentile --      Boys Diastolic BP Percentile --      Pulse Rate 02/01/24 1543 86     Resp 02/01/24 1543 16     Temp 02/01/24 1543 99.3 F (37.4 C)     Temp Source 02/01/24 1543 Oral     SpO2 02/01/24 1543 99 %     Weight --      Height --  Head Circumference --      Peak Flow --      Pain Score 02/01/24 1542 0     Pain Loc --      Pain Education --      Exclude from Growth Chart --    No data found.  Updated Vital Signs BP (!) 137/93 (BP Location: Left Arm)   Pulse 86   Temp 99.3 F (37.4 C) (Oral)   Resp 16   SpO2 99%   Visual Acuity Right Eye Distance:   Left Eye Distance:   Bilateral Distance:    Right Eye Near:   Left Eye Near:    Bilateral Near:     Physical Exam Constitutional:      General: He is not in acute distress.    Appearance: Normal appearance. He is not ill-appearing.  HENT:     Head: Normocephalic and atraumatic.     Left Ear: Tympanic membrane, ear canal and external ear normal.     Ears:     Comments: No rash Skin:    Findings: No rash.  Neurological:     Mental Status: He is alert and oriented to person, place, and time.     Cranial Nerves: Facial asymmetry present.     Comments: L sided facial droop compared with R. Can smile, raise eyebrow, puff out cheeks but  it is not symmetrical with R side. R eyelids do close completely.       UC Treatments / Results  Labs (all labs ordered are listed, but only abnormal results are displayed) Labs Reviewed - No data to display  EKG   Radiology No results found.  Procedures Procedures (including critical care time)  Medications Ordered in UC Medications - No data to display  Initial Impression / Assessment and Plan / UC Course  I have reviewed the triage vital signs and the nursing notes.  Pertinent labs & imaging results that were available during my care of the patient were reviewed by me and considered in my medical decision making (see chart for details).    I suspect Bells palsy. I recommended he get shingles vaccine when he is better. Discussed eye care if eyelids will not close properly (they close fine now)  Final Clinical Impressions(s) / UC Diagnoses   Final diagnoses:  Bell's palsy   Discharge Instructions   None    ED Prescriptions     Medication Sig Dispense Auth. Provider   predniSONE  (DELTASONE ) 20 MG tablet Take 3 tablets (60 mg total) by mouth daily with breakfast for 7 days. 21 tablet Richad Jon HERO, NP   valACYclovir  (VALTREX ) 1000 MG tablet Take 1 tablet (1,000 mg total) by mouth 3 (three) times daily for 7 days. 21 tablet Richad Jon HERO, NP      PDMP not reviewed this encounter.   Richad Jon HERO, NP 02/01/24 [phone]

## 2024-02-26 ENCOUNTER — Encounter (HOSPITAL_COMMUNITY): Payer: Self-pay

## 2024-02-26 ENCOUNTER — Ambulatory Visit (HOSPITAL_COMMUNITY)
Admission: EM | Admit: 2024-02-26 | Discharge: 2024-02-26 | Disposition: A | Discharge status: Home / Self Care | Payer: Self-pay | Source: Home / Self Care

## 2024-02-26 DIAGNOSIS — H9313 Tinnitus, bilateral: Secondary | ICD-10-CM

## 2024-02-26 MED ORDER — FLUTICASONE PROPIONATE 50 MCG/ACT NA SUSP: 1.0000 | Freq: Every day | NASAL | 0 refills | Status: AC

## 2024-02-26 MED ORDER — AMLODIPINE BESYLATE 5 MG PO TABS: 5.0000 mg | ORAL_TABLET | Freq: Every day | ORAL | 2 refills | Status: AC

## 2024-02-26 NOTE — ED Triage Notes (Signed)
 Pt c/o lt ear pain and bilateral ear ringing x1wk. States used OTC ear drops with no relief.   Pt c/o mouth dryness x 1 month. States dr. Archie said it could be high blood pressure. I tried some beet tablets.

## 2024-02-26 NOTE — Discharge Instructions (Addendum)
 Your blood pressure is elevated today. You have been given a prescription for amlodipine .  Take 1 tablet daily. Purchase a blood pressure cuff, monitor blood pressure daily, and keep a log of blood pressure readings to share with your primary care provider.  You have been given a prescription for Flonase .  Spray 1 spray in each nostril once daily.  For primary care services, [facility] has a sliding scale fee schedule.  They also accept most private insurance as well as Medicaid and Medicare.  Contact information: [address] [place], [place]  [address] [phone]  Follow-up with ENT for further evaluation ear ringing.  Contact information: [facility] [address]  [address] [place], [place]  [address] [phone]  If you develop any new or worsening symptoms or if your symptoms do not start to improve, please return here or follow-up with your primary care provider.  If your symptoms are severe, please go to the emergency room.

## 2024-02-26 NOTE — ED Provider Notes (Signed)
 [facility]    CSN: [id number] Arrival date & time: 02/26/24  9191      History   Chief Complaint Chief Complaint  Patient presents with   Otalgia    HPI Ivan Harper is a 53 y.o. male.   This 53 year old male is being seen for complaints of acute onset bilateral tinnitus.  He reports constant ringing in bilateral ears.  He says if he is busy doing things, he does not notice the ringing.  When he is sitting and things are quiet, he notices constant ringing in both ears.  He has a complaints of left greater than right ear pain.  He states symptoms have been ongoing for 1 week.  He has tried using over-the-counter drops for his ears with minimal relief.  He also reports mouth dryness for approximately 1 month.  He says he has googled his symptoms and found that excessive Tylenol  use and high blood pressure can cause his symptoms.  He says he was taking approximately 8 Tylenol  tablets a day when he had a headache, dental pain.  He says in the last week since googling this, he has stopped using Tylenol .  He denies aspirin use.  His blood pressure is elevated today at 157/100.  He is unable to see his PCP currently due to losing his job and no access to [employer]  Denies injury or trauma to his ears.  Denies headache, dizziness, increased blurry vision.  Denies nasal congestion, sore throat, shortness of breath, cough.  Denies chest pain.  Denies abdominal pain, nausea, vomiting.   Otalgia Associated symptoms: tinnitus   Associated symptoms: no abdominal pain, no congestion, no fever, no headaches, no hearing loss, no rash, no rhinorrhea, no sore throat and no vomiting     Past Medical History:  Diagnosis Date   Abdominal distension    Acute pancreatitis 06/25/2017   Fever    Hyperglycemia 06/25/2017   Hypokalemia    Pancreatitis    Penile swelling 06/28/2017    Patient Active Problem List   Diagnosis Date Noted   History of pancreatitis 10/05/2022    Hospital acquired PNA 07/01/2017   Ileus, unspecified (HCC) 06/30/2017   SBO (small bowel obstruction) (HCC) 06/29/2017   History of alcohol abuse 06/25/2017    Past Surgical History:  Procedure Laterality Date   APPENDECTOMY         Home Medications    Prior to Admission medications  Medication Sig Start Date End Date Taking? Authorizing Provider  acetaminophen  (TYLENOL ) 500 MG tablet Take 1,000 mg by mouth as needed.    [provider]  ibuprofen  (ADVIL ) 200 MG tablet Take 200-600 mg by mouth as needed.    [provider]    Family History Family History  Problem Relation Age of Onset   Healthy Mother    Hypertension Father    Diabetes Father    Alcohol abuse Father    Pancreatic disease Neg Hx     Social History Social History[1]   Allergies   Patient has no known allergies.   Review of Systems Review of Systems  Constitutional:  Negative for activity change, appetite change, chills and fever.  HENT:  Positive for ear pain and tinnitus. Negative for congestion, hearing loss, rhinorrhea, sinus pressure, sinus pain, sore throat and voice change.   Eyes:  Negative for pain and visual disturbance.  Respiratory:  Negative for shortness of breath.   Cardiovascular:  Negative for chest pain.  Gastrointestinal:  Negative for  abdominal pain, nausea and vomiting.  Musculoskeletal:  Negative for myalgias.  Skin:  Negative for color change and rash.  Neurological:  Negative for dizziness, speech difficulty and headaches.     Physical Exam Triage Vital Signs ED Triage Vitals [02/26/24 0832]  Encounter Vitals Group     BP      Girls Systolic BP Percentile      Girls Diastolic BP Percentile      Boys Systolic BP Percentile      Boys Diastolic BP Percentile      Pulse      Resp      Temp      Temp src      SpO2      Weight      Height      Head Circumference      Peak Flow      Pain Score 0     Pain Loc      Pain Education      Exclude  from Growth Chart    No data found.  Updated Vital Signs There were no vitals taken for this visit.  Visual Acuity Right Eye Distance:   Left Eye Distance:   Bilateral Distance:    Right Eye Near:   Left Eye Near:    Bilateral Near:     Physical Exam Vitals and nursing note reviewed.  Constitutional:      General: He is not in acute distress.    Appearance: He is well-developed. He is not ill-appearing or toxic-appearing.     Comments: Pleasant male appearing stated age found sitting in chair in no acute distress.  HENT:     Head: Normocephalic and atraumatic.     Right Ear: External ear normal. There is impacted cerumen.     Left Ear: External ear normal. There is impacted cerumen.     Nose:     Right Turbinates: Enlarged.     Left Turbinates: Enlarged.     Mouth/Throat:     Lips: Pink.     Mouth: Mucous membranes are moist. No oral lesions.     Pharynx: No pharyngeal swelling, oropharyngeal exudate or posterior oropharyngeal erythema.  Eyes:     Conjunctiva/sclera: Conjunctivae normal.  Cardiovascular:     Rate and Rhythm: Normal rate and regular rhythm.     Heart sounds: Normal heart sounds. No murmur heard. Pulmonary:     Effort: Pulmonary effort is normal. No respiratory distress.     Breath sounds: Normal breath sounds.  Skin:    General: Skin is warm and dry.  Neurological:     Mental Status: He is alert and oriented to person, place, and time.  Psychiatric:        Mood and Affect: Mood normal.        Behavior: Behavior is cooperative.      UC Treatments / Results  Labs (all labs ordered are listed, but only abnormal results are displayed) Labs Reviewed - No data to display  EKG   Radiology No results found.  Procedures Procedures (including critical care time)  Medications Ordered in UC Medications - No data to display  Initial Impression / Assessment and Plan / UC Course  I have reviewed the triage vital signs and the nursing  notes.  Pertinent labs & imaging results that were available during my care of the patient were reviewed by me and considered in my medical decision making (see chart for details).     Vitals and triage  reviewed.  Patient initially hypertensive with blood pressure 157/100.  He had ears are irrigated.  Nursing reports small amount of cerumen removed.  No improvement in tinnitus with ear irrigation.  Repeat blood pressure 145/96.  He denies history of hypertension.  Suspect eustachian tube dysfunction versus sensorineural hearing issue.  He is given a prescription for Flonase .  He is started on amlodipine  5 mg daily for hypertension.  He is advised to purchase blood pressure cuff, check blood pressure daily, and keep a log to follow-up with his primary care.  He is given contact information for ENT follow-up.  He is given information for [facility] for affordable primary care services.  Plan of care, follow-up care, return precautions given, no questions at this time. Final Clinical Impressions(s) / UC Diagnoses   Final diagnoses:  None   Discharge Instructions   None    ED Prescriptions   None    PDMP not reviewed this encounter.    [1]  Social History Tobacco Use   Smoking status: Former    Current packs/day: 0.00    Types: Cigarettes    Quit date: 1988    Years since quitting: 37.9   Smokeless tobacco: Never   Tobacco comments:    stopped 30 years ago  Vaping Use   Vaping status: Never Used  Substance Use Topics   Alcohol use: Not Currently    Comment: stopped drinking alcohol in 2019   Drug use: No     Lennice Jon BROCKS, FNP 02/26/24 [phone]

## 2024-04-01 ENCOUNTER — Ambulatory Visit
Admission: RE | Admit: 2024-04-01 | Discharge: 2024-04-01 | Disposition: A | Source: Ambulatory Visit | Attending: Infectious Diseases | Admitting: Infectious Diseases

## 2024-04-01 ENCOUNTER — Other Ambulatory Visit: Payer: Self-pay | Admitting: Infectious Diseases

## 2024-04-01 DIAGNOSIS — R7611 Nonspecific reaction to tuberculin skin test without active tuberculosis: Secondary | ICD-10-CM
